# Patient Record
Sex: Male | Born: 1970 | Race: White | Hispanic: No | Marital: Married | Smoking: Never smoker
Health system: Southern US, Community
[De-identification: ages and names within clinical notes are randomized; demographics above are authoritative.]

## PROBLEM LIST (undated history)

## (undated) DIAGNOSIS — G4730 Sleep apnea, unspecified: Secondary | ICD-10-CM

## (undated) DIAGNOSIS — I10 Essential (primary) hypertension: Secondary | ICD-10-CM

## (undated) DIAGNOSIS — F419 Anxiety disorder, unspecified: Secondary | ICD-10-CM

## (undated) HISTORY — PX: OTHER SURGICAL HISTORY: SHX169

## (undated) HISTORY — PX: HERNIA REPAIR: SHX51

---

## 2007-05-13 ENCOUNTER — Ambulatory Visit: Payer: Self-pay | Admitting: Family Medicine

## 2010-08-02 ENCOUNTER — Ambulatory Visit: Payer: Self-pay | Admitting: Internal Medicine

## 2011-06-20 DIAGNOSIS — E663 Overweight: Secondary | ICD-10-CM | POA: Insufficient documentation

## 2013-11-12 ENCOUNTER — Ambulatory Visit: Payer: Self-pay | Admitting: Family Medicine

## 2013-11-12 LAB — RAPID INFLUENZA A&B ANTIGENS

## 2013-12-15 ENCOUNTER — Ambulatory Visit: Payer: Self-pay | Admitting: Emergency Medicine

## 2014-03-31 ENCOUNTER — Ambulatory Visit: Payer: Self-pay | Admitting: Registered Nurse

## 2016-05-17 DIAGNOSIS — G4733 Obstructive sleep apnea (adult) (pediatric): Secondary | ICD-10-CM | POA: Insufficient documentation

## 2016-12-25 ENCOUNTER — Encounter: Payer: Self-pay | Admitting: Emergency Medicine

## 2016-12-25 ENCOUNTER — Ambulatory Visit
Admission: EM | Admit: 2016-12-25 | Discharge: 2016-12-25 | Disposition: A | Discharge status: Home / Self Care | Payer: Managed Care, Other (non HMO) | Attending: Emergency Medicine | Admitting: Emergency Medicine

## 2016-12-25 DIAGNOSIS — M5416 Radiculopathy, lumbar region: Secondary | ICD-10-CM | POA: Diagnosis not present

## 2016-12-25 MED ORDER — PREDNISONE 10 MG PO TABS: ORAL_TABLET | ORAL | 0 refills | Status: DC

## 2016-12-25 MED ORDER — HYDROCODONE-ACETAMINOPHEN 5-325 MG PO TABS
1.0000 | ORAL_TABLET | ORAL | 0 refills | Status: DC | PRN
Start: 2016-12-25 — End: 2018-04-23

## 2016-12-25 NOTE — ED Triage Notes (Signed)
Right side sciatica pain that radiates down leg to knee

## 2016-12-25 NOTE — Discharge Instructions (Signed)
Please take medications as prescribed.  Continue with Tylenol as needed.  Use Norco as needed for severe pain.  Follow-up with primary care provider or orthopedist in 1 week if no improvement.  Return to the clinic immediately for any severe pain, weakness, loss of bowel or bladder symptoms.

## 2016-12-25 NOTE — ED Provider Notes (Signed)
[facility]    CSN: [id number] Arrival date & time: 12/25/16  1028     History   Chief Complaint Chief Complaint  Patient presents with  . Sciatica    HPI Charles Webb is a 46 y.o. male clinic for evaluation of severe right leg pain.  Patient states he is having burning sensation rating down his right posterior thigh, posterior calf and into the bottom of his right foot.  Symptoms have been constant for the last 5 days.  He denies any trauma or injury.  No fevers, abdominal pain, chest pain or shortness of breath.  He denies any weakness or loss of bowel or bladder symptoms.  He has been taken Tylenol and ibuprofen, alternating 2 medications with minimal improvement.  Pain is currently 7 out of 10.  His pain is increased with sitting, he gets some relief with standing and walking.  HPI  History reviewed. No pertinent past medical history.  There are no active problems to display for this patient.   Past Surgical History:  Procedure Laterality Date  . HERNIA REPAIR    . vascetomy         Home Medications    Prior to Admission medications   Medication Sig Start Date End Date Taking? Authorizing Provider  venlafaxine (EFFEXOR) 75 MG tablet Take 75 mg by mouth 2 (two) times daily.   Yes [provider]  HYDROcodone-acetaminophen (NORCO) 5-325 MG tablet Take 1 tablet by mouth every 4 (four) hours as needed for moderate pain. 12/25/16   Evon SlackGaines, Jaiel Saraceno C, PA-C  predniSONE (DELTASONE) 10 MG tablet 10 day taper. 5,5,4,4,3,3,2,2,1,1 12/25/16   Evon SlackGaines, Astin Rape C, PA-C    Family History Family History  Problem Relation Age of Onset  . Heart failure Father     Social History Social History   Tobacco Use  . Smoking status: Never Smoker  . Smokeless tobacco: Never Used  Substance Use Topics  . Alcohol use: No    Frequency: Never  . Drug use: No     Allergies   Patient has no known allergies.   Review of Systems Review of Systems    Constitutional: Negative for fever.  Respiratory: Negative for shortness of breath.   Cardiovascular: Negative for chest pain.  Gastrointestinal: Negative for abdominal pain.  Genitourinary: Negative for difficulty urinating, dysuria and urgency.  Musculoskeletal: Positive for back pain. Negative for myalgias.  Skin: Negative for rash.  Neurological: Positive for numbness. Negative for dizziness and headaches.     Physical Exam Triage Vital Signs ED Triage Vitals  Enc Vitals Group     BP 12/25/16 1135 131/72     Pulse Rate 12/25/16 1135 66     Resp 12/25/16 1135 18     Temp 12/25/16 1135 98.9 F (37.2 C)     Temp Source 12/25/16 1135 Oral     SpO2 12/25/16 1135 98 %     Weight 12/25/16 1138 270 lb (122.5 kg)     Height 12/25/16 1138 5\' 10"  (1.778 m)     Head Circumference --      Peak Flow --      Pain Score 12/25/16 1138 7     Pain Loc --      Pain Edu? --      Excl. in GC? --    No data found.  Updated Vital Signs BP 131/72 (BP Location: Left Arm)   Pulse 66   Temp 98.9 F (37.2 C) (Oral)   Resp 18  Ht 5\' 10"  (1.778 m)   Wt 270 lb (122.5 kg)   SpO2 98%   BMI 38.74 kg/m   Visual Acuity Right Eye Distance:   Left Eye Distance:   Bilateral Distance:    Right Eye Near:   Left Eye Near:    Bilateral Near:     Physical Exam  Constitutional: He is oriented to person, place, and time. He appears well-developed and well-nourished.  HENT:  Head: Normocephalic and atraumatic.  Eyes: Conjunctivae are normal.  Neck: Normal range of motion.  Cardiovascular: Normal rate.  Pulmonary/Chest: Effort normal. No respiratory distress.  Musculoskeletal:  Lumbar Spine: Examination of the lumbar spine reveals no bony abnormality, no edema, and no ecchymosis.  There is no step off.  The patient has full range of motion of the lumbar spine with flexion and extension.  The patient has normal lateral bend and rotation.  The patient has no pain with range of motion activities.   The patient has a negative axial load test, and a negative rotational Waddell test.  The patient is non tender along the spinous process.  The patient is non tender along the paravertebral muscles, with no muscle spasms.  The patient is non tender along the iliac crest.  The patient is non tender in the sciatic notch.  The patient is non tender along the Sacroiliac joint.  There is no Coccyx joint tenderness.    Bilateral Lower Extremities: Examination of the lower extremities reveals no bony abnormality, no edema, and no ecchymosis.  The patient has full active and passive range of motion of the hips, knees, and ankles.  There is no discomfort with range of motion exercises.  The patient is non tender along the greater trochanter region.  The patient has a negative Denna HaggardHomans' test bilaterally.  There is normal skin warmth.  There is normal capillary refill bilaterally.    Neurologic: The patient has a negative straight leg raise.  The patient has normal muscle strength testing for the quadriceps, calves, ankle dorsiflexion, ankle plantarflexion, and extensor hallicus longus.  The patient has sensation that is intact to light touch.  Patellar reflexes are normal and symmetric.   Neurological: He is alert and oriented to person, place, and time.  Skin: Skin is warm. No rash noted.  Psychiatric: He has a normal mood and affect. His behavior is normal. Thought content normal.     UC Treatments / Results  Labs (all labs ordered are listed, but only abnormal results are displayed) Labs Reviewed - No data to display  EKG  EKG Interpretation None       Radiology No results found.  Procedures Procedures (including critical care time)  Medications Ordered in UC Medications - No data to display   Initial Impression / Assessment and Plan / UC Course  I have reviewed the triage vital signs and the nursing notes.  Pertinent labs & imaging results that were available during my care of the  patient were reviewed by me and considered in my medical decision making (see chart for details).     46 year old male with right lumbar radiculopathy, S1 distribution.  Symptoms been present for 5 days.  No weakness or neurological deficits.  He is started on a 10-day steroid taper and given a prescription for Norco as needed for severe pain.  Patient will follow up with primary care provider and symptoms 10 days if no improvement.  He is educated on signs and symptoms return to clinic for.  Final Clinical  Impressions(s) / UC Diagnoses   Final diagnoses:  Right lumbar radiculopathy    ED Discharge Orders        Ordered    predniSONE (DELTASONE) 10 MG tablet     12/25/16 1157    HYDROcodone-acetaminophen (NORCO) 5-325 MG tablet  Every 4 hours PRN     12/25/16 1157          Evon Slack, [place] 12/25/16 1202

## 2017-03-10 ENCOUNTER — Ambulatory Visit
Admission: EM | Admit: 2017-03-10 | Discharge: 2017-03-10 | Disposition: A | Discharge status: Home / Self Care | Payer: Managed Care, Other (non HMO) | Attending: Family Medicine | Admitting: Family Medicine

## 2017-03-10 ENCOUNTER — Other Ambulatory Visit: Payer: Self-pay

## 2017-03-10 ENCOUNTER — Encounter: Payer: Self-pay | Admitting: Emergency Medicine

## 2017-03-10 DIAGNOSIS — J029 Acute pharyngitis, unspecified: Secondary | ICD-10-CM | POA: Diagnosis not present

## 2017-03-10 DIAGNOSIS — J069 Acute upper respiratory infection, unspecified: Secondary | ICD-10-CM

## 2017-03-10 DIAGNOSIS — B9789 Other viral agents as the cause of diseases classified elsewhere: Secondary | ICD-10-CM | POA: Diagnosis not present

## 2017-03-10 LAB — RAPID STREP SCREEN (MED CTR MEBANE ONLY): Streptococcus, Group A Screen (Direct): NEGATIVE

## 2017-03-10 NOTE — ED Provider Notes (Signed)
[facility]    CSN: [id number] Arrival date & time: 03/10/17  0802     History   Chief Complaint Chief Complaint  Patient presents with  . Sore Throat  . Generalized Body Aches    HPI Charles Webb is a 47 y.o. male.   The history is provided by the patient.  Sore Throat  Pertinent negatives include no headaches.  URI  Presenting symptoms: congestion, cough, rhinorrhea and sore throat   Severity:  Moderate Onset quality:  Sudden Duration:  5 days Timing:  Constant Progression:  Unchanged Chronicity:  New Relieved by:  OTC medications Associated symptoms: myalgias   Associated symptoms: no arthralgias, no headaches, no sinus pain and no wheezing   Risk factors: sick contacts   Risk factors: not elderly, no chronic cardiac disease, no chronic kidney disease, no chronic respiratory disease, no diabetes mellitus, no immunosuppression, no recent illness and no recent travel     History reviewed. No pertinent past medical history.  There are no active problems to display for this patient.   Past Surgical History:  Procedure Laterality Date  . HERNIA REPAIR    . vascetomy         Home Medications    Prior to Admission medications   Medication Sig Start Date End Date Taking? Authorizing Provider  venlafaxine (EFFEXOR) 75 MG tablet Take 75 mg by mouth 2 (two) times daily.   Yes [provider]  HYDROcodone-acetaminophen (NORCO) 5-325 MG tablet Take 1 tablet by mouth every 4 (four) hours as needed for moderate pain. 12/25/16   Evon Slack, PA-C  predniSONE (DELTASONE) 10 MG tablet 10 day taper. 5,5,4,4,3,3,2,2,1,1 12/25/16   Evon Slack, PA-C    Family History Family History  Problem Relation Age of Onset  . Heart failure Father     Social History Social History   Tobacco Use  . Smoking status: Never Smoker  . Smokeless tobacco: Never Used  Substance Use Topics  . Alcohol use: No    Frequency: Never  . Drug use: No       Allergies   Patient has no known allergies.   Review of Systems Review of Systems  HENT: Positive for congestion, rhinorrhea and sore throat. Negative for sinus pain.   Respiratory: Positive for cough. Negative for wheezing.   Musculoskeletal: Positive for myalgias. Negative for arthralgias.  Neurological: Negative for headaches.     Physical Exam Triage Vital Signs ED Triage Vitals  Enc Vitals Group     BP 03/10/17 0812 136/84     Pulse Rate 03/10/17 0812 74     Resp 03/10/17 0812 16     Temp 03/10/17 0812 98.5 F (36.9 C)     Temp Source 03/10/17 0812 Oral     SpO2 03/10/17 0812 97 %     Weight 03/10/17 0812 275 lb (124.7 kg)     Height 03/10/17 0812 5\' 10"  (1.778 m)     Head Circumference --      Peak Flow --      Pain Score 03/10/17 0811 4     Pain Loc --      Pain Edu? --      Excl. in GC? --    No data found.  Updated Vital Signs BP 136/84 (BP Location: Left Arm)   Pulse 74   Temp 98.5 F (36.9 C) (Oral)   Resp 16   Ht 5\' 10"  (1.778 m)   Wt 275 lb (124.7 kg)  SpO2 97%   BMI 39.46 kg/m   Visual Acuity Right Eye Distance:   Left Eye Distance:   Bilateral Distance:    Right Eye Near:   Left Eye Near:    Bilateral Near:     Physical Exam  Constitutional: He appears well-developed and well-nourished. No distress.  HENT:  Head: Normocephalic and atraumatic.  Right Ear: Tympanic membrane, external ear and ear canal normal.  Left Ear: Tympanic membrane, external ear and ear canal normal.  Nose: Nose normal.  Mouth/Throat: Uvula is midline, oropharynx is clear and moist and mucous membranes are normal. No oropharyngeal exudate or tonsillar abscesses.  Eyes: Conjunctivae and EOM are normal. Pupils are equal, round, and reactive to light. Right eye exhibits no discharge. Left eye exhibits no discharge. No scleral icterus.  Neck: Normal range of motion. Neck supple. No tracheal deviation present. No thyromegaly present.  Cardiovascular: Normal  rate, regular rhythm and normal heart sounds.  Pulmonary/Chest: Effort normal and breath sounds normal. No stridor. No respiratory distress. He has no wheezes. He has no rales. He exhibits no tenderness.  Lymphadenopathy:    He has no cervical adenopathy.  Neurological: He is alert.  Skin: Skin is warm and dry. No rash noted. He is not diaphoretic.  Nursing note and vitals reviewed.    UC Treatments / Results  Labs (all labs ordered are listed, but only abnormal results are displayed) Labs Reviewed  RAPID STREP SCREEN (NOT AT [facility])  CULTURE, GROUP A STREP [facility])    EKG  EKG Interpretation None       Radiology No results found.  Procedures Procedures (including critical care time)  Medications Ordered in UC Medications - No data to display   Initial Impression / Assessment and Plan / UC Course  I have reviewed the triage vital signs and the nursing notes.  Pertinent labs & imaging results that were available during my care of the patient were reviewed by me and considered in my medical decision making (see chart for details).      Final Clinical Impressions(s) / UC Diagnoses   Final diagnoses:  Viral URI    ED Discharge Orders    None     1. Lab results and diagnosis reviewed with patient 2. Recommend supportive treatment with rest, fluids, otc analgesics 3. Follow-up prn if symptoms worsen or don't improve  Controlled Substance Prescriptions [place] Controlled Substance Registry consulted? Not Applicable   Payton Mccallumonty, Edana Aguado, MD 03/10/17 1019

## 2017-03-10 NOTE — ED Triage Notes (Signed)
Patient c/o sore throat, bodyaches, nasal congestion that started on Monday.  Patient denies fevers.

## 2017-03-12 LAB — CULTURE, GROUP A STREP (THRC)

## 2017-03-13 ENCOUNTER — Telehealth: Payer: Self-pay

## 2017-03-13 NOTE — Telephone Encounter (Signed)
Called to follow up with patient since visit here at [facility]. Patient instructed to call back with any questions or concerns. MAH  

## 2018-04-23 ENCOUNTER — Other Ambulatory Visit: Payer: Self-pay

## 2018-04-23 ENCOUNTER — Ambulatory Visit (INDEPENDENT_AMBULATORY_CARE_PROVIDER_SITE_OTHER): Payer: 59

## 2018-04-23 ENCOUNTER — Ambulatory Visit
Admission: EM | Admit: 2018-04-23 | Discharge: 2018-04-23 | Disposition: A | Discharge status: Home / Self Care | Payer: 59 | Attending: Family Medicine | Admitting: Family Medicine

## 2018-04-23 ENCOUNTER — Encounter: Payer: Self-pay | Admitting: Emergency Medicine

## 2018-04-23 DIAGNOSIS — W1789XA Other fall from one level to another, initial encounter: Secondary | ICD-10-CM

## 2018-04-23 DIAGNOSIS — S2231XA Fracture of one rib, right side, initial encounter for closed fracture: Secondary | ICD-10-CM

## 2018-04-23 DIAGNOSIS — M25511 Pain in right shoulder: Secondary | ICD-10-CM | POA: Diagnosis not present

## 2018-04-23 HISTORY — DX: Anxiety disorder, unspecified: F41.9

## 2018-04-23 MED ORDER — HYDROCODONE-ACETAMINOPHEN 5-325 MG PO TABS: 1.0000 | ORAL_TABLET | Freq: Three times a day (TID) | ORAL | 0 refills | Status: DC | PRN

## 2018-04-23 MED ORDER — MELOXICAM 15 MG PO TABS: 15.0000 mg | ORAL_TABLET | Freq: Every day | ORAL | 0 refills | Status: DC | PRN

## 2018-04-23 NOTE — ED Triage Notes (Signed)
Patient in today c/o right sided rib/chest pain and right shoulder/arm pain after falling ~6 feet and landing on his right side on Saturday (04/21/18).

## 2018-04-23 NOTE — ED Provider Notes (Signed)
[facility]    CSN: [id number] Arrival date & time: 04/23/18  1142   History   Chief Complaint Chief Complaint  Patient presents with  . Rib Injury    DOI 04/21/18 APPT  . Shoulder Injury   HPI  48 year old male presents with the above complaints.  Patient states that he fell out of a boat in the driveway Saturday.  He landed on the ground on his right side.  Since that time he has had right-sided rib pain and shoulder pain.  Worse with deep inspiration.  Worse with cough.  Pain is 7/10 in severity.  No relieving factors.  Denies bruising.  Denies shortness of breath.  No reports of head injury.  No other associated symptoms.  No other complaints  PMH, Surgical Hx, Family Hx, Social History reviewed and updated as below.  Past Medical History:  Diagnosis Date  . Anxiety    Past Surgical History:  Procedure Laterality Date  . HERNIA REPAIR    . vascetomy     Home Medications    Prior to Admission medications   Medication Sig Start Date End Date Taking? Authorizing Provider  venlafaxine (EFFEXOR) 75 MG tablet Take 75 mg by mouth 2 (two) times daily.   Yes [provider]  HYDROcodone-acetaminophen (NORCO/VICODIN) 5-325 MG tablet Take 1 tablet by mouth every 8 (eight) hours as needed for moderate pain or severe pain. 04/23/18   Tommie Sams, DO  meloxicam (MOBIC) 15 MG tablet Take 1 tablet (15 mg total) by mouth daily as needed for pain. 04/23/18   Tommie Sams, DO    Family History Family History  Problem Relation Age of Onset  . Heart failure Father   . Healthy Mother     Social History Social History   Tobacco Use  . Smoking status: Never Smoker  . Smokeless tobacco: Never Used  Substance Use Topics  . Alcohol use: Yes    Frequency: Never    Comment: rarely  . Drug use: No     Allergies   Patient has no known allergies.   Review of Systems Review of Systems  Musculoskeletal:       Rib pain/chest pain (R) R shoulder pain.    Physical Exam Triage Vital Signs ED Triage Vitals  Enc Vitals Group     BP 04/23/18 1203 (!) 159/90     Pulse Rate 04/23/18 1203 68     Resp 04/23/18 1203 16     Temp 04/23/18 1203 98.3 F (36.8 C)     Temp Source 04/23/18 1203 Oral     SpO2 04/23/18 1203 99 %     Weight 04/23/18 1201 280 lb (127 kg)     Height 04/23/18 1201  (1.778 m)     Head Circumference --      Peak Flow --      Pain Score 04/23/18 1201 7     Pain Loc --      Pain Edu? --      Excl. in GC? --    Updated Vital Signs BP (!) 159/90 (BP Location: Left Arm)   Pulse 68   Temp 98.3 F (36.8 C) (Oral)   Resp 16   Ht  (1.778 m)   Wt 127 kg   SpO2 99%   BMI 40.18 kg/m   Visual Acuity Right Eye Distance:   Left Eye Distance:   Bilateral Distance:    Right Eye Near:   Left Eye Near:  Bilateral Near:     Physical Exam Vitals signs and nursing note reviewed.  Constitutional:      Appearance: Normal appearance. He is obese.  HENT:     Head: Normocephalic and atraumatic.  Eyes:     General:        Right eye: No discharge.        Left eye: No discharge.     Conjunctiva/sclera: Conjunctivae normal.  Cardiovascular:     Rate and Rhythm: Normal rate and regular rhythm.  Pulmonary:     Effort: Pulmonary effort is normal.     Breath sounds: Normal breath sounds.  Chest:     Chest wall: No tenderness.  Musculoskeletal:     Comments: Right shoulder -rotator cuff strength intact.  Decreased range of motion secondary to pain.  Skin:    General: Skin is warm.     Findings: No bruising.  Neurological:     Mental Status: He is alert.  Psychiatric:        Mood and Affect: Mood normal.        Behavior: Behavior normal.    UC Treatments / Results  Labs (all labs ordered are listed, but only abnormal results are displayed) Labs Reviewed - No data to display  EKG None  Radiology Dg Ribs Unilateral W/chest Right  Result Date: 04/23/2018 CLINICAL DATA:  Cough, chest pain after fall.  EXAM: RIGHT RIBS AND CHEST - 3+ VIEW COMPARISON:  None. FINDINGS: Minimally displaced fracture is seen involving lateral portion of right sixth rib. There is no evidence of pneumothorax or pleural effusion. Both lungs are clear. Heart size and mediastinal contours are within normal limits. IMPRESSION: Minimally displaced right sixth rib fracture. Electronically Signed   By: Lupita Raider, M.D.   On: 04/23/2018 12:59    Procedures Procedures (including critical care time)  Medications Ordered in UC Medications - No data to display  Initial Impression / Assessment and Plan / UC Course  I have reviewed the triage vital signs and the nursing notes.  Pertinent labs & imaging results that were available during my care of the patient were reviewed by me and considered in my medical decision making (see chart for details).    48 year old male presents for evaluation after suffering a fall.  X-ray revealed a right sixth rib fracture.  Patient also has pain in the right shoulder.  Rest, ice.  Meloxicam as directed.  Vicodin as needed for severe pain associated with his right rib fracture.  [place] controlled substance database reviewed.  No concerns for abuse.  Final Clinical Impressions(s) / UC Diagnoses   Final diagnoses:  Closed fracture of one rib of right side, initial encounter  Acute pain of right shoulder     Discharge Instructions     Rest.  Pain medication as needed.  Be sure to take deep breaths.  Take care  Dr. Adriana Simas     ED Prescriptions    Medication Sig Dispense Auth. Provider   HYDROcodone-acetaminophen (NORCO/VICODIN) 5-325 MG tablet Take 1 tablet by mouth every 8 (eight) hours as needed for moderate pain or severe pain. 10 tablet Louie Meaders G, DO   meloxicam (MOBIC) 15 MG tablet Take 1 tablet (15 mg total) by mouth daily as needed for pain. 30 tablet Tommie Sams, DO     Controlled Substance Prescriptions [place] Controlled Substance Registry consulted? Yes,  I have consulted the [place] Controlled Substances Registry for this patient, and feel the risk/benefit ratio today is favorable  for proceeding with this prescription for a controlled substance.   Tommie Sams, [place] 04/23/18 1315

## 2018-04-23 NOTE — Discharge Instructions (Addendum)
Rest.  Pain medication as needed.  Be sure to take deep breaths.  Take care  Dr. Adriana Simas

## 2018-04-24 ENCOUNTER — Telehealth: Payer: Self-pay | Admitting: Family Medicine

## 2018-04-24 MED ORDER — OXYCODONE-ACETAMINOPHEN 5-325 MG PO TABS: ORAL_TABLET | ORAL | 0 refills | Status: DC

## 2018-04-24 NOTE — Telephone Encounter (Signed)
rx printed

## 2018-05-14 ENCOUNTER — Other Ambulatory Visit: Payer: Self-pay | Admitting: Family Medicine

## 2018-05-18 ENCOUNTER — Other Ambulatory Visit: Payer: Self-pay

## 2018-05-18 ENCOUNTER — Encounter: Payer: Self-pay | Admitting: Emergency Medicine

## 2018-05-18 ENCOUNTER — Ambulatory Visit (INDEPENDENT_AMBULATORY_CARE_PROVIDER_SITE_OTHER): Payer: 59

## 2018-05-18 ENCOUNTER — Ambulatory Visit
Admission: EM | Admit: 2018-05-18 | Discharge: 2018-05-18 | Disposition: A | Discharge status: Home / Self Care | Payer: 59 | Attending: Urgent Care | Admitting: Urgent Care

## 2018-05-18 DIAGNOSIS — M25511 Pain in right shoulder: Secondary | ICD-10-CM

## 2018-05-18 DIAGNOSIS — W1789XD Other fall from one level to another, subsequent encounter: Secondary | ICD-10-CM

## 2018-05-18 MED ORDER — TRAMADOL HCL 50 MG PO TABS: 50.0000 mg | ORAL_TABLET | Freq: Two times a day (BID) | ORAL | 0 refills | Status: DC | PRN

## 2018-05-18 NOTE — ED Provider Notes (Signed)
[address], [address] [place], [place] [address] [phone]   Name: Charles Webb DOB: [birth year] MRN: [id number] CSN: [id number]  Arrival date and time:  05/18/18 0831  Chief Complaint:  Shoulder Pain (right)  NOTE: Prior to seeing the patient today, I have reviewed the triage nursing documentation and vital signs. Clinical staff has updated patient's PMH/PSHx, current medication list, and drug allergies/intolerances to ensure comprehensive history available to assist in medical decision making.   History:   HPI: Charles Webb is a 48 y.o. male who presents today with complaints of RIGHT shoulder pain x 1 month. Patient suffered a 6 foot fall that resulted in a rib fracture and post-traumatic pain in the shoulder. Patient was seen her at [facility] by Dr. Adriana Simas; notes reviewed. Rotator cuff felt to be stable; no clinical concerns for underlying cuff arthropathy at that time. Patient was prescribed a course of meloxicam and advised to rest and ice his shoulder. No imaging was obtained of the affected shoulder following the initial incident.   He presents today with continued pain in the shoulder. Patient has full AROM, however he notes constant "aching" pain ranging 4-5/10. He notes that his pain is worse at night and interferes with his sleep. Patient denies distal paraesthesias, however he does note that his arm is "weaker than it normally is".   Past Medical History:  Diagnosis Date  . Anxiety     Past Surgical History:  Procedure Laterality Date  . HERNIA REPAIR    . vascetomy      Family History  Problem Relation Age of Onset  . Heart failure Father   . Healthy Mother     Social History   Socioeconomic History  . Marital status: Married    Spouse name: Not on file  . Number of children: Not on file  . Years of education: Not on file  . Highest education level: Not on file  Occupational History  . Not on file  Social Needs  . Financial resource strain: Not  on file  . Food insecurity:    Worry: Not on file    Inability: Not on file  . Transportation needs:    Medical: Not on file    Non-medical: Not on file  Tobacco Use  . Smoking status: Never Smoker  . Smokeless tobacco: Never Used  Substance and Sexual Activity  . Alcohol use: Yes    Frequency: Never    Comment: rarely  . Drug use: No  . Sexual activity: Yes  Lifestyle  . Physical activity:    Days per week: Not on file    Minutes per session: Not on file  . Stress: Not on file  Relationships  . Social connections:    Talks on phone: Not on file    Gets together: Not on file    Attends religious service: Not on file    Active member of club or organization: Not on file    Attends meetings of clubs or organizations: Not on file    Relationship status: Not on file  . Intimate partner violence:    Fear of current or ex partner: Not on file    Emotionally abused: Not on file    Physically abused: Not on file    Forced sexual activity: Not on file  Other Topics Concern  . Not on file  Social History Narrative  . Not on file    There are no active problems to display for this patient.  Home Medications:    Current Meds  Medication Sig  . meloxicam (MOBIC) 15 MG tablet Take 1 tablet (15 mg total) by mouth daily as needed for pain.  Marland Kitchen venlafaxine (EFFEXOR) 75 MG tablet Take 75 mg by mouth 2 (two) times daily.    Allergies:   Patient has no known allergies.  Review of Systems (ROS): Review of Systems  Constitutional: Negative for activity change and fatigue.  Respiratory: Negative for cough and shortness of breath.   Cardiovascular: Negative for chest pain and palpitations.  Musculoskeletal:       RIGHT shoulder pain  Skin: Negative for color change and rash.  Neurological: Positive for weakness (slight RUE). Negative for numbness.  All other systems reviewed and are negative.    Physical Exam:  Triage Vital Signs ED Triage Vitals  Enc Vitals Group      BP 05/18/18 0903 (!) 158/94     Pulse Rate 05/18/18 0903 77     Resp 05/18/18 0903 18     Temp 05/18/18 0903 98.5 F (36.9 C)     Temp Source 05/18/18 0903 Oral     SpO2 05/18/18 0903 98 %     Weight 05/18/18 0900 285 lb (129.3 kg)     Height 05/18/18 0900  (1.778 m)     Head Circumference --      Peak Flow --      Pain Score 05/18/18 0900 4     Pain Loc --      Pain Edu? --      Excl. in GC? --     Physical Exam  Constitutional: He is oriented to person, place, and time and well-developed, well-nourished, and in no distress.  HENT:  Mouth/Throat: Mucous membranes are normal.  Eyes: Pupils are equal, round, and reactive to light. EOM are normal.  Cardiovascular: Normal rate, regular rhythm, normal heart sounds and intact distal pulses. Exam reveals no gallop and no friction rub.  No murmur heard. Pulmonary/Chest: Effort normal and breath sounds normal. No respiratory distress. He has no wheezes. He has no rales.  Musculoskeletal:     Right shoulder: He exhibits pain. He exhibits no swelling, no spasm, normal pulse and normal strength.  Neurological: He is alert and oriented to person, place, and time.  Skin: Skin is warm and dry. No rash noted. No erythema.  Psychiatric: Mood, affect and judgment normal.  Nursing note and vitals reviewed.    Urgent Care Treatments / Results:   LABS: PLEASE NOTE: all labs that were ordered this encounter are listed, however only abnormal results are displayed. Labs Reviewed - No data to display  RADIOLOGY: Dg Shoulder Right  Result Date: 05/18/2018 CLINICAL DATA:  Pain and decreased range of motion EXAM: RIGHT SHOULDER - 2+ VIEW COMPARISON:  None. FINDINGS: Oblique, Y scapular, and axillary images were obtained. There is no evident acute fracture or dislocation. There is moderate narrowing in the acromioclavicular joint region with mild narrowing of the glenohumeral joint. A small calcification is noted immediately lateral to the  acromion. No other intra-articular calcification evident. No erosive change. Visualized right lung is clear. 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 Probable calcific tendinosis immediately lateral to the right acromion. No fracture or dislocation. Electronically Signed   By: Bretta Bang III M.D.   On: 05/18/2018 09:26    PRODEDURES: Procedures  MEDICATIONS RECEIVED THIS VISIT: Medications - No data to display     Initial Impression /  Assessment and Plan / Urgent Care Course:   Pertinent labs & imaging results that were available during my care of the patient were personally reviewed by me and considered in my medical decision making (see chart for details).   Charles Webb is a 48 y.o. male who [facility] with complaints of continued pain in his RIGHT shoulder s/p a fall approximately 1 month ago. Exam reveals full AROM, however he notes consistent "aching" pain that is worse at night. Patient previously seen here in the past and prescribed interventions, however he advises that pain persists despite interventions.   Diagnostic plain films reviewed as negative for acute osseous concern. There are some degenerative changes noted. Patient encouraged to continue heat/ice as needed for comfort. He will continue the meloxicam as previous prescribed; still has supply. Will treat continued pain with tramadol and refer to orthopedics for further evaluation of possible cuff arthropathy.   I have reviewed the follow up and strict return precautions for any new or worsening symptoms. Patient is aware of symptoms that would be deemed urgent/emergent, and would thus require further evaluation either here or in the emergency department. At the time of discharge, he verbalized understanding and consent with the discharge plan as it was reviewed with him. All questions were fielded by provider and/or  clinic staff prior to patient discharge.    Final Clinical Impressions(s) / Urgent Care Diagnoses:   Final diagnoses:  Right shoulder pain, unspecified chronicity    New Prescriptions:   Meds ordered this encounter  Medications  . traMADol (ULTRAM) 50 MG tablet    Sig: Take 1 tablet (50 mg total) by mouth every 12 (twelve) hours as needed.    Dispense:  15 tablet    Refill:  0    Controlled Substance Prescriptions:  [place] Controlled Substance Registry consulted? Yes - I have consulted the  Controlled Substances Registry for this patient, and feel the risk/benefit ratio today is favorable for proceeding with this prescription for a controlled substance.  NOTE: This note was prepared using Scientist, clinical (histocompatibility and immunogenetics)Dragon dictation software along with smaller Lobbyistphrase technology. Despite my best ability to proofread, there is the potential that transcriptional errors may still occur from this process, and are completely unintentional.     Verlee MonteGray, Beulah Capobianco E, NP 05/18/18 1002
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
## 2018-05-18 NOTE — Discharge Instructions (Signed)
It was very nice meeting you today in clinic. Thank you for entrusting me with your care.   As discussed, your plain films are negative for anything acute. They showed some arthritic changes. You may have an injury to your rotator cuff that will require further imaging.  Schedule appointment with orthopedics for further evaluation. I have given your the name of an excellent provider.  Please utilize the medications that we discussed. Your prescription has been called in to your pharmacy.  Moist heat, alternating with ice, application several times a day may help improve your pain.   Make arrangements to follow up with your regular doctor and/or the specialist for re-evaluation and further treatment as dicussed. If your symptoms/condition worsens, please seek follow up care either here or in the ER. Please remember, our [facility] providers are "right here with you" when you need [place].   Again, it was my pleasure to take care of you today. Thank you for choosing our clinic. I hope that you start to feel better quickly.   Quentin Mulling, MSN, APRN, FNP-C, CEN Advanced Practice Provider [facility]

## 2018-06-19 ENCOUNTER — Other Ambulatory Visit: Payer: Self-pay | Admitting: Orthopedic Surgery

## 2018-06-19 DIAGNOSIS — M25311 Other instability, right shoulder: Secondary | ICD-10-CM

## 2018-06-19 DIAGNOSIS — S46001D Unspecified injury of muscle(s) and tendon(s) of the rotator cuff of right shoulder, subsequent encounter: Secondary | ICD-10-CM

## 2018-07-02 ENCOUNTER — Ambulatory Visit
Admission: RE | Admit: 2018-07-02 | Discharge: 2018-07-02 | Disposition: A | Discharge status: Home / Self Care | Payer: 59 | Source: Ambulatory Visit | Attending: Orthopedic Surgery | Admitting: Orthopedic Surgery

## 2018-07-02 ENCOUNTER — Other Ambulatory Visit: Payer: Self-pay

## 2018-07-02 DIAGNOSIS — M25311 Other instability, right shoulder: Secondary | ICD-10-CM

## 2018-07-02 DIAGNOSIS — S46001D Unspecified injury of muscle(s) and tendon(s) of the rotator cuff of right shoulder, subsequent encounter: Secondary | ICD-10-CM

## 2018-07-24 ENCOUNTER — Encounter
Admission: RE | Admit: 2018-07-24 | Discharge: 2018-07-24 | Disposition: A | Discharge status: Home / Self Care | Payer: 59 | Source: Ambulatory Visit | Attending: Orthopedic Surgery | Admitting: Orthopedic Surgery

## 2018-07-24 ENCOUNTER — Other Ambulatory Visit: Payer: Self-pay

## 2018-07-24 HISTORY — DX: Sleep apnea, unspecified: G47.30

## 2018-07-24 NOTE — Patient Instructions (Signed)
Your procedure is scheduled on: July 30, 2018 [facility] Report to Day Surgery on the 2nd floor of the [facility]. To find out your arrival time, please call [phone] between 1PM - 3PM on: July 27, 2018 Friday   REMEMBER: Instructions that are not followed completely may result in serious medical risk, up to and including death; or upon the discretion of your surgeon and anesthesiologist your surgery may need to be rescheduled.  Do not eat food after midnight the night before surgery.  No gum chewing, lozengers or hard candies.  You may however, drink CLEAR liquids up to 2 hours before you are scheduled to arrive for your surgery. Do not drink anything within 2 hours of the start of your surgery.  Clear liquids include: - water  - apple juice without pulp -CLEAR gatorade - black coffee or tea (Do NOT add milk or creamers to the coffee or tea) Do NOT drink anything that is not on this list.  Type 1 and Type 2 diabetics should only drink water.  No Alcohol for 24 hours before or after surgery.  No Smoking including e-cigarettes for 24 hours prior to surgery.  No chewable tobacco products for at least 6 hours prior to surgery.  No nicotine patches on the day of surgery.  On the morning of surgery brush your teeth with toothpaste and water, you may rinse your mouth with mouthwash if you wish. Do not swallow any toothpaste or mouthwash.  Notify your doctor if there is any change in your medical condition (cold, fever, infection).  Do not wear jewelry, make-up, hairpins, clips or nail polish.  Do not wear lotions, powders, or perfumes.   Do not shave 48 hours prior to surgery.   Contacts and dentures may not be worn into surgery.  Do not bring valuables to the hospital, including drivers license, insurance or credit cards.  [facility] is not responsible for any belongings or valuables.   TAKE THESE MEDICATIONS THE MORNING OF SURGERY: VENLAFAXINE   Use CHG Soap  as  directed on instruction sheet.  Bring your C-PAP to the hospital with you in case you may have to spend the night.   Stop Anti-inflammatories (NSAIDS) such as MELOXICAM  Advil, Aleve, Ibuprofen, Motrin, Naproxen, Naprosyn and Aspirin based products such as Excedrin, Goodys Powder, BC Powder. (May take Tylenol or Acetaminophen if needed.)  Stop ANY OVER THE COUNTER supplements until after surgery. (May continue Vitamin D, Vitamin B, and multivitamin.)  Wear comfortable clothing (specific to your surgery type) to the hospital.  Plan for stool softeners for home use.   If you are being discharged the day of surgery, you will not be allowed to drive home. You will need a responsible adult to drive you home and stay with you that night.   If you are taking public transportation, you will need to have a responsible adult with you. Please confirm with your physician that it is acceptable to use public transportation.   Please call [phone] if you have any questions about these instructions.

## 2018-07-26 ENCOUNTER — Other Ambulatory Visit: Payer: Self-pay

## 2018-07-26 ENCOUNTER — Other Ambulatory Visit
Admission: RE | Admit: 2018-07-26 | Discharge: 2018-07-26 | Disposition: A | Discharge status: Home / Self Care | Payer: 59 | Source: Ambulatory Visit | Attending: Orthopedic Surgery | Admitting: Orthopedic Surgery

## 2018-07-26 DIAGNOSIS — Z1159 Encounter for screening for other viral diseases: Secondary | ICD-10-CM | POA: Diagnosis not present

## 2018-07-27 LAB — NOVEL CORONAVIRUS, NAA (HOSP ORDER, SEND-OUT TO REF LAB; TAT 18-24 HRS): SARS-CoV-2, NAA: NOT DETECTED

## 2018-07-29 MED ORDER — DEXTROSE 5 % IV SOLN
3.0000 g | Freq: Once | INTRAVENOUS | Status: AC
  Administered 2018-07-30: 3 g via INTRAVENOUS
  Filled 2018-07-29: qty 3

## 2018-07-30 ENCOUNTER — Ambulatory Visit: Payer: 59 | Admitting: Certified Registered"

## 2018-07-30 ENCOUNTER — Other Ambulatory Visit: Payer: Self-pay

## 2018-07-30 ENCOUNTER — Encounter: Payer: Self-pay | Admitting: Emergency Medicine

## 2018-07-30 ENCOUNTER — Encounter
Admission: RE | Disposition: A | Discharge status: Home / Self Care | Payer: Self-pay | Source: Home / Self Care | Attending: Orthopedic Surgery

## 2018-07-30 ENCOUNTER — Ambulatory Visit
Admission: RE | Admit: 2018-07-30 | Discharge: 2018-07-30 | Disposition: A | Discharge status: Home / Self Care | Payer: 59 | Attending: Orthopedic Surgery | Admitting: Orthopedic Surgery

## 2018-07-30 DIAGNOSIS — Z79899 Other long term (current) drug therapy: Secondary | ICD-10-CM | POA: Diagnosis not present

## 2018-07-30 DIAGNOSIS — F419 Anxiety disorder, unspecified: Secondary | ICD-10-CM | POA: Insufficient documentation

## 2018-07-30 DIAGNOSIS — G4730 Sleep apnea, unspecified: Secondary | ICD-10-CM | POA: Insufficient documentation

## 2018-07-30 DIAGNOSIS — S46011A Strain of muscle(s) and tendon(s) of the rotator cuff of right shoulder, initial encounter: Secondary | ICD-10-CM | POA: Insufficient documentation

## 2018-07-30 DIAGNOSIS — W1789XA Other fall from one level to another, initial encounter: Secondary | ICD-10-CM | POA: Diagnosis not present

## 2018-07-30 HISTORY — PX: SHOULDER ARTHROSCOPY WITH SUBACROMIAL DECOMPRESSION, ROTATOR CUFF REPAIR AND BICEP TENDON REPAIR: SHX5687

## 2018-07-30 SURGERY — SHOULDER ARTHROSCOPY WITH SUBACROMIAL DECOMPRESSION, ROTATOR CUFF REPAIR AND BICEP TENDON REPAIR
Anesthesia: General | Laterality: Right

## 2018-07-30 MED ORDER — LACTATED RINGERS IV SOLN
INTRAVENOUS | Status: DC
  Administered 2018-07-30: 08:00:00 via INTRAVENOUS

## 2018-07-30 MED ORDER — FAMOTIDINE 20 MG PO TABS
20.0000 mg | ORAL_TABLET | Freq: Once | ORAL | Status: AC
  Administered 2018-07-30: 06:00:00 20 mg via ORAL

## 2018-07-30 MED ORDER — ACETAMINOPHEN 10 MG/ML IV SOLN
INTRAVENOUS | Status: DC | PRN
  Administered 2018-07-30: 1000 mg via INTRAVENOUS

## 2018-07-30 MED ORDER — SUGAMMADEX SODIUM 500 MG/5ML IV SOLN
INTRAVENOUS | Status: AC
  Filled 2018-07-30: qty 5

## 2018-07-30 MED ORDER — ROCURONIUM BROMIDE 50 MG/5ML IV SOLN
INTRAVENOUS | Status: AC
  Filled 2018-07-30: qty 2

## 2018-07-30 MED ORDER — SUCCINYLCHOLINE CHLORIDE 20 MG/ML IJ SOLN
INTRAMUSCULAR | Status: DC | PRN
  Administered 2018-07-30: 200 mg via INTRAVENOUS

## 2018-07-30 MED ORDER — FENTANYL CITRATE (PF) 100 MCG/2ML IJ SOLN
INTRAMUSCULAR | Status: AC
  Administered 2018-07-30: 50 ug via INTRAVENOUS
  Filled 2018-07-30: qty 2

## 2018-07-30 MED ORDER — FENTANYL CITRATE (PF) 100 MCG/2ML IJ SOLN
INTRAMUSCULAR | Status: DC | PRN
  Administered 2018-07-30 (×4): 50 ug via INTRAVENOUS

## 2018-07-30 MED ORDER — DEXMEDETOMIDINE HCL IN NACL 80 MCG/20ML IV SOLN
INTRAVENOUS | Status: AC
  Filled 2018-07-30: qty 20

## 2018-07-30 MED ORDER — BUPIVACAINE HCL (PF) 0.5 % IJ SOLN
INTRAMUSCULAR | Status: DC | PRN
  Administered 2018-07-30: 3 mL via PERINEURAL
  Administered 2018-07-30: 7 mL via PERINEURAL

## 2018-07-30 MED ORDER — MIDAZOLAM HCL 2 MG/2ML IJ SOLN
INTRAMUSCULAR | Status: AC
  Administered 2018-07-30: 1 mg via INTRAVENOUS
  Filled 2018-07-30: qty 2

## 2018-07-30 MED ORDER — EPHEDRINE SULFATE 50 MG/ML IJ SOLN
INTRAMUSCULAR | Status: AC
  Filled 2018-07-30: qty 1

## 2018-07-30 MED ORDER — ONDANSETRON HCL 4 MG/2ML IJ SOLN
INTRAMUSCULAR | Status: AC
  Filled 2018-07-30: qty 2

## 2018-07-30 MED ORDER — ACETAMINOPHEN 500 MG PO TABS: 1000.0000 mg | ORAL_TABLET | Freq: Three times a day (TID) | ORAL | 2 refills | Status: AC

## 2018-07-30 MED ORDER — FENTANYL CITRATE (PF) 100 MCG/2ML IJ SOLN
INTRAMUSCULAR | Status: AC
  Filled 2018-07-30: qty 2

## 2018-07-30 MED ORDER — FAMOTIDINE 20 MG PO TABS
ORAL_TABLET | ORAL | Status: AC
  Administered 2018-07-30: 20 mg via ORAL
  Filled 2018-07-30: qty 1

## 2018-07-30 MED ORDER — BUPIVACAINE LIPOSOME 1.3 % IJ SUSP
INTRAMUSCULAR | Status: DC | PRN
  Administered 2018-07-30: 13 mL via PERINEURAL
  Administered 2018-07-30: 7 mL via PERINEURAL

## 2018-07-30 MED ORDER — FENTANYL CITRATE (PF) 100 MCG/2ML IJ SOLN
50.0000 ug | Freq: Once | INTRAMUSCULAR | Status: AC
  Administered 2018-07-30: 50 ug via INTRAVENOUS

## 2018-07-30 MED ORDER — MIDAZOLAM HCL 2 MG/2ML IJ SOLN
INTRAMUSCULAR | Status: DC | PRN
  Administered 2018-07-30: 1 mg via INTRAVENOUS

## 2018-07-30 MED ORDER — SEVOFLURANE IN SOLN
RESPIRATORY_TRACT | Status: AC
  Filled 2018-07-30: qty 250

## 2018-07-30 MED ORDER — DEXMEDETOMIDINE HCL IN NACL 200 MCG/50ML IV SOLN
INTRAVENOUS | Status: DC | PRN
  Administered 2018-07-30: 4 ug via INTRAVENOUS
  Administered 2018-07-30: 8 ug via INTRAVENOUS
  Administered 2018-07-30: 4 ug via INTRAVENOUS

## 2018-07-30 MED ORDER — MIDAZOLAM HCL 2 MG/2ML IJ SOLN
INTRAMUSCULAR | Status: AC
  Filled 2018-07-30: qty 2

## 2018-07-30 MED ORDER — ONDANSETRON HCL 4 MG/2ML IJ SOLN
INTRAMUSCULAR | Status: DC | PRN
  Administered 2018-07-30: 4 mg via INTRAVENOUS

## 2018-07-30 MED ORDER — SUGAMMADEX SODIUM 500 MG/5ML IV SOLN
INTRAVENOUS | Status: DC | PRN
  Administered 2018-07-30: 400 mg via INTRAVENOUS

## 2018-07-30 MED ORDER — OXYCODONE HCL 5 MG/5ML PO SOLN: 5.0000 mg | Freq: Once | ORAL | Status: DC | PRN

## 2018-07-30 MED ORDER — ACETAMINOPHEN 10 MG/ML IV SOLN
INTRAVENOUS | Status: AC
  Filled 2018-07-30: qty 100

## 2018-07-30 MED ORDER — PROPOFOL 10 MG/ML IV BOLUS
INTRAVENOUS | Status: DC | PRN
  Administered 2018-07-30: 200 mg via INTRAVENOUS
  Administered 2018-07-30: 30 mg via INTRAVENOUS

## 2018-07-30 MED ORDER — ASPIRIN EC 325 MG PO TBEC: 325.0000 mg | DELAYED_RELEASE_TABLET | Freq: Every day | ORAL | 0 refills | Status: AC

## 2018-07-30 MED ORDER — PHENYLEPHRINE HCL (PRESSORS) 10 MG/ML IV SOLN
INTRAVENOUS | Status: DC | PRN
  Administered 2018-07-30 (×3): 50 ug via INTRAVENOUS

## 2018-07-30 MED ORDER — LIDOCAINE-EPINEPHRINE 1 %-1:100000 IJ SOLN
INTRAMUSCULAR | Status: AC
  Filled 2018-07-30: qty 1

## 2018-07-30 MED ORDER — DEXAMETHASONE SODIUM PHOSPHATE 10 MG/ML IJ SOLN
INTRAMUSCULAR | Status: AC
  Filled 2018-07-30: qty 1

## 2018-07-30 MED ORDER — ROCURONIUM BROMIDE 100 MG/10ML IV SOLN
INTRAVENOUS | Status: DC | PRN
  Administered 2018-07-30: 30 mg via INTRAVENOUS
  Administered 2018-07-30: 50 mg via INTRAVENOUS
  Administered 2018-07-30: 20 mg via INTRAVENOUS
  Administered 2018-07-30: 30 mg via INTRAVENOUS

## 2018-07-30 MED ORDER — MIDAZOLAM HCL 2 MG/2ML IJ SOLN
1.0000 mg | Freq: Once | INTRAMUSCULAR | Status: AC
  Administered 2018-07-30: 1 mg via INTRAVENOUS

## 2018-07-30 MED ORDER — EPHEDRINE SULFATE 50 MG/ML IJ SOLN
INTRAMUSCULAR | Status: DC | PRN
  Administered 2018-07-30 (×2): 5 mg via INTRAVENOUS

## 2018-07-30 MED ORDER — OXYCODONE HCL 5 MG PO TABS: 5.0000 mg | ORAL_TABLET | Freq: Once | ORAL | Status: DC | PRN

## 2018-07-30 MED ORDER — BUPIVACAINE LIPOSOME 1.3 % IJ SUSP
INTRAMUSCULAR | Status: AC
  Filled 2018-07-30: qty 20

## 2018-07-30 MED ORDER — LACTATED RINGERS IV SOLN
INTRAVENOUS | Status: DC | PRN
  Administered 2018-07-30: 13 mL

## 2018-07-30 MED ORDER — LIDOCAINE HCL (PF) 1 % IJ SOLN
INTRAMUSCULAR | Status: AC
  Filled 2018-07-30: qty 5

## 2018-07-30 MED ORDER — PHENYLEPHRINE HCL (PRESSORS) 10 MG/ML IV SOLN
INTRAVENOUS | Status: AC
  Filled 2018-07-30: qty 1

## 2018-07-30 MED ORDER — EPINEPHRINE (ANAPHYLAXIS) 30 MG/30ML IJ SOLN
INTRAMUSCULAR | Status: AC
  Filled 2018-07-30: qty 30

## 2018-07-30 MED ORDER — ONDANSETRON 4 MG PO TBDP: 4.0000 mg | ORAL_TABLET | Freq: Three times a day (TID) | ORAL | 0 refills | Status: DC | PRN

## 2018-07-30 MED ORDER — PROPOFOL 10 MG/ML IV BOLUS
INTRAVENOUS | Status: AC
  Filled 2018-07-30: qty 40

## 2018-07-30 MED ORDER — SUCCINYLCHOLINE CHLORIDE 20 MG/ML IJ SOLN
INTRAMUSCULAR | Status: AC
  Filled 2018-07-30: qty 1

## 2018-07-30 MED ORDER — OXYCODONE HCL 5 MG PO TABS: 5.0000 mg | ORAL_TABLET | ORAL | 0 refills | Status: AC | PRN

## 2018-07-30 MED ORDER — DEXAMETHASONE SODIUM PHOSPHATE 10 MG/ML IJ SOLN
INTRAMUSCULAR | Status: DC | PRN
  Administered 2018-07-30: 10 mg via INTRAVENOUS

## 2018-07-30 MED ORDER — BUPIVACAINE HCL (PF) 0.5 % IJ SOLN
INTRAMUSCULAR | Status: AC
  Filled 2018-07-30: qty 10

## 2018-07-30 MED ORDER — FENTANYL CITRATE (PF) 100 MCG/2ML IJ SOLN: 25.0000 ug | INTRAMUSCULAR | Status: DC | PRN

## 2018-07-30 SURGICAL SUPPLY — 82 items
ADAPTER IRRIG TUBE 2 SPIKE SOL (ADAPTER) ×6 IMPLANT
ANCHOR SUT BIOCOMP LK 2.9X12.5 (Anchor) ×3 IMPLANT
BLADE OSCILLATING/SAGITTAL (BLADE)
BLADE SW THK.38XMED LNG THN (BLADE) IMPLANT
BUR BR 5.5 12 FLUTE (BURR) ×3 IMPLANT
BUR RADIUS 4.0X18.5 (BURR) IMPLANT
CANNULA 5.75X7CM (CANNULA)
CANNULA PART THRD DISP 5.75X7 (CANNULA) IMPLANT
CANNULA PARTIAL THREAD 2X7 (CANNULA) ×3 IMPLANT
CANNULA TWIST IN 8.25X9CM (CANNULA) IMPLANT
CHLORAPREP W/TINT 26 (MISCELLANEOUS) ×3 IMPLANT
CLOSURE WOUND 1/2 X4 (GAUZE/BANDAGES/DRESSINGS)
COOLER POLAR GLACIER W/PUMP (MISCELLANEOUS) ×3 IMPLANT
COVER LIGHT HANDLE STERIS (MISCELLANEOUS) ×3 IMPLANT
COVER WAND RF STERILE (DRAPES) ×3 IMPLANT
CRADLE LAMINECT ARM (MISCELLANEOUS) ×6 IMPLANT
DERMABOND ADVANCED (GAUZE/BANDAGES/DRESSINGS)
DERMABOND ADVANCED .7 DNX12 (GAUZE/BANDAGES/DRESSINGS) IMPLANT
DRAPE IMP U-DRAPE 54X76 (DRAPES) ×6 IMPLANT
DRAPE INCISE IOBAN 66X45 STRL (DRAPES) ×3 IMPLANT
DRAPE SHEET LG 3/4 BI-LAMINATE (DRAPES) ×3 IMPLANT
DRAPE STERI 35X30 U-POUCH (DRAPES) ×3 IMPLANT
DRAPE U-SHAPE 47X51 STRL (DRAPES) ×3 IMPLANT
DRSG TEGADERM 4X4.75 (GAUZE/BANDAGES/DRESSINGS) ×9 IMPLANT
ELECT REM PT RETURN 9FT ADLT (ELECTROSURGICAL) ×3
ELECTRODE REM PT RTRN 9FT ADLT (ELECTROSURGICAL) ×1 IMPLANT
GAUZE SPONGE 4X4 12PLY STRL (GAUZE/BANDAGES/DRESSINGS) ×3 IMPLANT
GAUZE XEROFORM 1X8 LF (GAUZE/BANDAGES/DRESSINGS) ×3 IMPLANT
GLOVE BIOGEL PI IND STRL 8 (GLOVE) ×1 IMPLANT
GLOVE BIOGEL PI INDICATOR 8 (GLOVE) ×2
GLOVE SURG SYN 8.0 (GLOVE) ×3 IMPLANT
GOWN STRL REUS W/ TWL LRG LVL3 (GOWN DISPOSABLE) ×1 IMPLANT
GOWN STRL REUS W/TWL LRG LVL3 (GOWN DISPOSABLE) ×2
GOWN STRL REUS W/TWL LRG LVL4 (GOWN DISPOSABLE) ×3 IMPLANT
IMP SYSTEM BRIDGE 4.75X19.1 (Anchor) ×3 IMPLANT
IMPL SYSTEM BRIDGE 4.75X19.1 (Anchor) ×1 IMPLANT
IV LACTATED RINGER IRRG 3000ML (IV SOLUTION) ×34
IV LR IRRIG 3000ML ARTHROMATIC (IV SOLUTION) ×17 IMPLANT
KIT INSERTION 2.9 PUSHLOCK (KITS) ×3 IMPLANT
KIT STABILIZATION SHOULDER (MISCELLANEOUS) ×3 IMPLANT
KIT TURNOVER KIT A (KITS) ×3 IMPLANT
MANIFOLD NEPTUNE II (INSTRUMENTS) ×3 IMPLANT
MASK FACE SPIDER DISP (MASK) ×3 IMPLANT
MAT ABSORB  FLUID 56X50 GRAY (MISCELLANEOUS) ×4
MAT ABSORB FLUID 56X50 GRAY (MISCELLANEOUS) ×2 IMPLANT
NDL SAFETY ECLIPSE 18X1.5 (NEEDLE) ×1 IMPLANT
NEEDLE HYPO 18GX1.5 SHARP (NEEDLE) ×2
NEEDLE HYPO 22GX1.5 SAFETY (NEEDLE) ×3 IMPLANT
NEEDLE MAYO 6 CRC TAPER PT (NEEDLE) IMPLANT
NEEDLE SCORPION MULTI FIRE (NEEDLE) ×3 IMPLANT
PACK ARTHROSCOPY SHOULDER (MISCELLANEOUS) ×3 IMPLANT
PAD ABD DERMACEA PRESS 5X9 (GAUZE/BANDAGES/DRESSINGS) ×3 IMPLANT
PAD WRAPON POLAR SHDR XLG (MISCELLANEOUS) ×1 IMPLANT
SET TUBE SUCT SHAVER OUTFL 24K (TUBING) ×3 IMPLANT
SET TUBE TIP INTRA-ARTICULAR (MISCELLANEOUS) ×3 IMPLANT
SLING ULTRA II M (MISCELLANEOUS) IMPLANT
STAPLER SKIN PROX 35W (STAPLE) IMPLANT
STRAP SAFETY 5IN WIDE (MISCELLANEOUS) ×3 IMPLANT
STRIP CLOSURE SKIN 1/2X4 (GAUZE/BANDAGES/DRESSINGS) IMPLANT
SUT ETHILON 3-0 (SUTURE) ×3 IMPLANT
SUT ETHILON 3-0 FS-10 30 BLK (SUTURE) ×3
SUT ETHILON 4-0 (SUTURE) ×2
SUT ETHILON 4-0 FS2 18XMFL BLK (SUTURE) ×1
SUT LASSO 90 DEG SD STR (SUTURE) IMPLANT
SUT MNCRL 4-0 (SUTURE)
SUT MNCRL 4-0 27XMFL (SUTURE)
SUT PROLENE 0 CT 2 (SUTURE) IMPLANT
SUT TICRON 2-0 30IN 311381 (SUTURE) IMPLANT
SUT VIC AB 0 CT1 36 (SUTURE) IMPLANT
SUT VIC AB 2-0 CT2 27 (SUTURE) IMPLANT
SUT VICRYL 3-0 27IN (SUTURE) IMPLANT
SUTURE EHLN 3-0 FS-10 30 BLK (SUTURE) ×1 IMPLANT
SUTURE ETHLN 4-0 FS2 18XMF BLK (SUTURE) ×1 IMPLANT
SUTURE MNCRL 4-0 27XMF (SUTURE) IMPLANT
SYR 10ML LL (SYRINGE) ×3 IMPLANT
TAPE CLOTH 3X10 WHT NS LF (GAUZE/BANDAGES/DRESSINGS) IMPLANT
TAPE MICROFOAM 4IN (TAPE) ×3 IMPLANT
TUBING ARTHRO INFLOW-ONLY STRL (TUBING) ×3 IMPLANT
TUBING CONNECTING 10 (TUBING) ×2 IMPLANT
TUBING CONNECTING 10' (TUBING) ×1
WAND WEREWOLF FLOW 90D (MISCELLANEOUS) ×3 IMPLANT
WRAPON POLAR PAD SHDR XLG (MISCELLANEOUS) ×3

## 2018-07-30 NOTE — H&P (Signed)
Paper H&P to be scanned into permanent record. H&P reviewed. No significant changes noted.  

## 2018-07-30 NOTE — Anesthesia Post-op Follow-up Note (Signed)
Anesthesia QCDR form completed.        

## 2018-07-30 NOTE — Anesthesia Postprocedure Evaluation (Signed)
Anesthesia Post Note  Patient: Charles Webb  Procedure(s) Performed: SHOULDER ARTHROSCOPY WITH ROTATOR CUFF REPAIR, SUBACROMIAL DECOMPRESSION, BICEPS TENODISIS, RIGHT, SLEEP APNEA (Right )  Patient location during evaluation: PACU Anesthesia Type: General Level of consciousness: awake and alert Pain management: pain level controlled Vital Signs Assessment: post-procedure vital signs reviewed and stable Respiratory status: spontaneous breathing, nonlabored ventilation, respiratory function stable and patient connected to nasal cannula oxygen Cardiovascular status: blood pressure returned to baseline and stable Postop Assessment: no apparent nausea or vomiting Anesthetic complications: no     Last Vitals:  Vitals:   07/30/18 1113 07/30/18 1151  BP: 138/87 136/69  Pulse: 65 (!) 58  Resp: 18 16  Temp: (!) 36.1 C   SpO2: 96% 94%    Last Pain:  Vitals:   07/30/18 1151  TempSrc:   PainSc: 0-No pain                 Precious Haws Piscitello

## 2018-07-30 NOTE — Discharge Instructions (Addendum)
Post-Op Instructions - Rotator Cuff Repair ° °1. Bracing: You will wear a shoulder immobilizer or sling for 6 weeks.  ° °2. Driving: No driving for 3 weeks post-op. When driving, do not wear the immobilizer. Ideally, we recommend no driving for 6 weeks while sling is in place as one arm will be immobilized.  ° °3. Activity: No active lifting for 2 months. Wrist, hand, and elbow motion only. Avoid lifting the upper arm away from the body except for hygiene. You are permitted to bend and straighten the elbow passively only (no active elbow motion). You may use your hand and wrist for typing, writing, and managing utensils (cutting food). Do not lift more than a coffee cup for 8 weeks.  When sleeping or resting, inclined positions (recliner chair or wedge pillow) and a pillow under the forearm for support may provide better comfort for up to 4 weeks.  Avoid long distance travel for 4 weeks. ° °Return to normal activities after rotator cuff repair repair normally takes 6 months on average. If rehab goes very well, may be able to do most activities at 4 months, except overhead or contact sports. ° °4. Physical Therapy: Begins 3-4 days after surgery, and proceed 1 time per week for the first 6 weeks, then 1-2 times per week from weeks 6-20 post-op. ° °5. Medications:  °- You will be provided a prescription for narcotic pain medicine. After surgery, take 1-2 narcotic tablets every 4 hours if needed for severe pain.  °- A prescription for anti-nausea medication will be provided in case the narcotic medicine causes nausea - take 1 tablet every 6 hours only if nauseated.   °- Take tylenol 1000 mg (2 Extra Strength tablets or 3 regular strength) every 8 hours for pain.  May decrease or stop tylenol 5 days after surgery if you are having minimal pain. °- Take ASA 325mg/day x 2 weeks to help prevent DVTs/PEs (blood clots).  °- DO NOT take ANY nonsteroidal anti-inflammatory pain medications (Advil, Motrin, Ibuprofen, Aleve,  Naproxen, or Naprosyn). These medicines can inhibit healing of your shoulder repair.  ° ° °If you are taking prescription medication for anxiety, depression, insomnia, muscle spasm, chronic pain, or for attention deficit disorder, you are advised that you are at a higher risk of adverse effects with use of narcotics post-op, including narcotic addiction/dependence, depressed breathing, death. °If you use non-prescribed substances: alcohol, marijuana, cocaine, heroin, methamphetamines, etc., you are at a higher risk of adverse effects with use of narcotics post-op, including narcotic addiction/dependence, depressed breathing, death. °You are advised that taking > 50 morphine milligram equivalents (MME) of narcotic pain medication per day results in twice the risk of overdose or death. For your prescription provided: oxycodone 5 mg - taking more than 6 tablets per day would result in > 50 morphine milligram equivalents (MME) of narcotic pain medication. °Be advised that we will prescribe narcotics short-term, for acute post-operative pain only - 3 weeks for major operations such as shoulder repair/reconstruction surgeries.  ° ° ° °6. Post-Op Appointment: ° °Your first post-op appointment will be 10-14 days post-op. ° °7. Work or School: For most, but not all procedures, we advise staying out of work or school for at least 1 to 2 weeks in order to recover from the stress of surgery and to allow time for healing.  ° °If you need a work or school note this can be provided.  ° °8. Smoking: If you are a smoker, you need to refrain from   smoking in the postoperative period. The nicotine in cigarettes will inhibit healing of your shoulder repair and decrease the chance of successful repair. Similarly, nicotine containing products (gum, patches) should be avoided.  ° °Post-operative Brace: °Apply and remove the brace you received as you were instructed to at the time of fitting and as described in detail as the brace’s  instructions for use indicate.  Wear the brace for the period of time prescribed by your physician.  The brace can be cleaned with soap and water and allowed to air dry only.  Should the brace result in increased pain, decreased feeling (numbness/tingling), increased swelling or an overall worsening of your medical condition, please contact your doctor immediately.  If an emergency situation occurs as a result of wearing the brace after normal business hours, please dial 911 and seek immediate medical attention.  Let your doctor know if you have any further questions about the brace issued to you. °Refer to the shoulder sling instructions for use if you have any questions regarding the correct fit of your shoulder sling.  °[facility] Customer Care for Troubleshooting: [phone] ° °Video that illustrates how to properly use a shoulder sling: °"Instructions for Proper Use of an Orthopaedic Sling" °https://www.youtube.com/watch?v=AHZpn_Xo45w ° ° ° ° °Interscalene Nerve Block, Care After °This sheet gives you information about how to care for yourself after your procedure. Your health care provider may also give you more specific instructions. If you have problems or questions, contact your health care provider. °What can I expect after the procedure? °After the procedure, it is common to have: °· Soreness or tenderness in your neck. °· Numbness in your shoulder, upper arm, and some fingers. °· Weakness in your shoulder and arm muscles. °The feeling and strength in your shoulder, arm, and fingers should return to normal within hours after your procedure. °Follow these instructions at home: °For at least 24 hours after the procedure: °· Do not: °? Participate in activities in which you could fall or become injured. °? Drive. °? Use heavy machinery. °? Drink alcohol. °? Take sleeping pills or medicines that cause drowsiness. °? Make important decisions or sign legal documents. °? Take care of children on your  own. °· Rest. °Eating and drinking °· If you vomit, drink water, juice, or soup when you can drink without vomiting. °· Make sure you have little or no nausea before eating solid foods. °· Follow the diet that is recommended by your health care provider. °If you have a sling: °· Wear it as told by your health care provider. Remove it only as told by your health care provider. °· Loosen the sling if your fingers tingle, become numb, or turn cold and blue. °· Make sure that your entire arm, including your wrist, is supported. Do not allow your wrist to dangle over the end of the sling. °· Do not let your sling get wet if it is not waterproof. °· Keep the sling clean. °Bathing °· Do not take baths, swim, or use a hot tub until your health care provider approves. °· If you have a nerve block catheter in place, keep the incision site and tubing dry. °Injection site care ° °· Wash your hands with soap and water before you change your bandage (dressing). If soap and water are not available, use hand sanitizer. °· Change your dressing as told by your health care provider. °· Keep your dressing dry. °· Check your nerve block injection site every day for signs of infection. Check for: °?   Redness, swelling, or pain. °? Fluid or blood. °? Warmth. °Activity °· Do not perform complex or risky activities while taking prescription pain medicine and until you have fully recovered. °· Return to your normal activities as told by your health care provider and as you can tolerate them. Ask your health care provider what activities are safe for you. °· Rest and take it easy. This will help you heal and recover more quickly and fully. °· Be very cautious until you have regained strength and sensation. °General instructions °· Have a responsible adult stay with you until you are awake and alert. °· Do not drive or use heavy machinery while taking prescription pain medicine and until you have fully recovered. Ask your health care provider  when it is safe to drive. °· Take over-the-counter and prescription medicines only as told by your health care provider. °· If you smoke, do not smoke without supervision. °· Do not expose your arm or shoulder to very cold or very hot temperatures until you have full feeling back. °· If you have a nerve block catheter in place: °? Try to keep the catheter from getting kinked or pinched. °? Avoid pulling or tugging on the catheter. °· Keep all follow-up visits as told by your health care provider. This is important. °Contact a health care provider if: °· You have chills or fever. °· You have redness, swelling, or pain around your injection site. °· You have fluid or blood coming from the injection site. °· The skin around the injection site is warm to the touch. °· There is a bad smell coming from your dressing. °· You have hoarseness or a drooping or dry eye that lasts more than a few days. °· You have pain that is poorly controlled with the block or with pain medicine. °· You have numbness, tingling, or weakness in your shoulder or arm that lasts for more than one week. °Get help right away if: °· You have severe pain. °· You lose or do not regain strength and sensation in your arm even after the nerve block medicine has stopped. °· You have trouble breathing. °· You have a nerve block catheter still in place and you begin to shiver. °· You have a nerve block catheter still in place and you are getting more and more numb or weak. °This information is not intended to replace advice given to you by your health care provider. Make sure you discuss any questions you have with your health care provider. °Document Released: 01/09/2015 Document Revised: 01/20/2017 Document Reviewed: 09/18/2015 °Elsevier Patient Education © 2020 [employer] ° °AMBULATORY SURGERY  °DISCHARGE INSTRUCTIONS ° ° °1) The drugs that you were given will stay in your system until tomorrow so for the next 24 hours you should not: ° °A) Drive an  automobile °B) Make any legal decisions °C) Drink any alcoholic beverage ° ° °2) You may resume regular meals tomorrow.  Today it is better to start with liquids and gradually work up to solid foods. ° °You may eat anything you prefer, but it is better to start with liquids, then soup and crackers, and gradually work up to solid foods. ° ° °3) Please notify your doctor immediately if you have any unusual bleeding, trouble breathing, redness and pain at the surgery site, drainage, fever, or pain not relieved by medication. ° ° ° °4) Additional Instructions: ° ° ° ° ° ° ° °Please contact your physician with any problems or Same Day Surgery at   [phone], Monday through Friday 6 am to 4 pm, or [place] at [place] [facility] number at [phone]. ° ° °

## 2018-07-30 NOTE — Transfer of Care (Signed)
Immediate Anesthesia Transfer of Care Note  Patient: Charles Webb  Procedure(s) Performed: SHOULDER ARTHROSCOPY WITH MINI OPEN ROTATOR CUFF REPAIR, SUBACROMIAL DECOMPRESSION, BICEPS TENODISIS, RIGHT, SLEEP APNEA (Right )  Patient Location: PACU  Anesthesia Type:General  Level of Consciousness: drowsy  Airway & Oxygen Therapy: Patient Spontanous Breathing and Patient connected to face mask oxygen  Post-op Assessment: Report given to RN and Post -op Vital signs reviewed and stable  Post vital signs: stable  Last Vitals:  Vitals Value Taken Time  BP 127/74 07/30/18 1026  Temp    Pulse 59 07/30/18 1034  Resp 18 07/30/18 1034  SpO2 99 % 07/30/18 1034  Vitals shown include unvalidated device data.  Last Pain:  Vitals:   07/30/18 1027  TempSrc:   PainSc: (P) Asleep         Complications: No apparent anesthesia complications

## 2018-07-30 NOTE — Op Note (Addendum)
SURGERY DATE: 07/30/2018   PRE-OP DIAGNOSIS:  1. Right subacromial impingement 2. Right biceps tendinopathy 3. Right rotator cuff tear  POST-OP DIAGNOSIS: 1. Right subacromial impingement 2. Right biceps tendinopathy 3. Right rotator cuff tear  PROCEDURES:  1. Right arthroscopic rotator cuff repair 2. Right arthroscopic biceps tenodesis 3. Right subacromial decompression 4. Right extensive debridement of shoulder (glenohumeral and subacromial spaces)  SURGEON: Cato Mulligan, MD  ASSISTANT: Anitra Lauth, PA  ANESTHESIA: Gen with postoperative interscalene block  ESTIMATED BLOOD LOSS: 5cc  DRAINS:  none  TOTAL IV FLUIDS: per anesthesia   SPECIMENS: none  IMPLANTS:   - Arthrex 2.72m Pushlock - x1 - Arthrex 4.716mSwiveLock - x4 (speed bridge kit)   OPERATIVE FINDINGS:  Examination under anesthesia: A careful examination under anesthesia was performed.  Passive range of motion was: FF: 160; ER at side: 45; ER in abduction: 90; IR in abduction: 45.  Anterior load shift: NT.  Posterior load shift: NT.  Sulcus in neutral: NT.  Sulcus in ER: NT.    Intra-operative findings: A thorough arthroscopic examination of the shoulder was performed.  The findings are: 1. Biceps tendon: tendinopathy with significant erythema biceps anchor fraying at the biceps anchor 2. Superior labrum: Normal 3. Posterior labrum and capsule: normal 4. Inferior capsule and inferior recess: normal 5. Glenoid cartilage surface: Normal except mild fraying posteriorly 6. Supraspinatus attachment: Full-thickness tear of the anterior supraspinatus 7. Posterior rotator cuff attachment: normal 8. Humeral head articular cartilage: normal 9. Rotator interval: Significant synovitis 10: Subscapularis tendon: attachment intact 11. Anterior labrum: Mild degeneration 12. IGHL: normal  OPERATIVE REPORT:   Indications for procedure: Charles Webb is a [age].o. year old male who Initially had an injury on  04/23/2018 where he fell onto his shoulder from a height of approximately 7 feet.  He sustained fractured rib at that point that has since healed appropriately.  He underwent a course of medical management and physical therapy without improvement in his symptoms.  Clinical exam and MRI were concerning for full-thickness rotator cuff tear, biceps tendinopathy, and subacromial impingement. After discussion of risks, benefits, and alternatives to surgery, the patient elected to proceed.    Procedure in detail: I identified Charles Webb in the pre-operative holding area.  I marked the operative shoulder with my initials. I reviewed the risks and benefits of the proposed surgical intervention, and the patient (and/or patient's guardian) wished to proceed.  Anesthesia was then performed with an interscalene block.  The patient was transferred to the operative suite and placed in the beach chair position.    SCDs were placed on the lower extremities. Appropriate IV antibiotics were administered within 1 hour before incision. The operative upper extremity was then prepped and draped in standard fashion. A time out was performed confirming the correct extremity, correct patient and correct procedure.   I then created a standard posterior portal with an 11 blade. The glenohumeral joint was easily entered with a blunt trochar and the arthroscope introduced. The findings of diagnostic arthroscopy are described above. I debrided degenerative tissue including labrum and cartilaginous surfaces and also debrided and coagulated the inflamed synovium to obtain hemostasis and reduce the risk of post-operative swelling using an Arthrocare radiofrequency device.   I then turned my attention to the arthroscopic biceps tenodesis.  I used the loop n tack technique to pass a fiber tape through the biceps in a locked fashion adjacent to the biceps anchor.  A hole for a 2.9 mm  Arthrex PushLock was drilled in the bicipital  groove just superior to the subscapularis tendon insertion.  The fiber tape was loaded onto the push lock anchor and impacted into place into the previously drilled hole in the bicipital groove.  This appropriately secured the biceps into the bicipital groove and took it off of tension.  Next, the arthroscope was then introduced into the subacromial space. A direct lateral portal was created with an 11-blade after spinal needle localization. An extensive subacromial bursectomy was performed using a combination of the shaver and Arthrocare wand. The entire acromial undersurface was exposed and the CA ligament was subperiosteally elevated to expose the anterior acromial hook. A burr was used to create a flat anterior and lateral aspect of the acromion, converting it from a Type 2 to a Type 1 acromion. Care was made to keep the deltoid fascia intact.  Then, I created 2 accessory anterolateral portals to assist with visualization and instrumentation.  I debrided the poor quality edges of the supraspinatus tendon.  This was a U-shaped tear of the anterior supraspinatus.  I prepared the footprint using a burr to expose bleeding bone.   I then percutaneously placed two medial row anchors. These were placed at the anterior and posterior borders of the tear, at the articular margin. I then shuttled the combined strand of tape from each anchor through the rotator cuff just lateral to the musculotendinous junction using an I then took one tape limb from each of the 2 tapes in each anchor and fixed them 1 cm distal to the tip of the greater tuberosity in line with the anteromedial and posteromedial anchors to create a crossed, double row suture configuration. Lateral row fixation was obtained with two 4.75 mm Arthrex SwiveLock anchors with tapes placed under appropriate tension.. This afforded appropriate reduction of the rotator cuff tear with homogeneous compression of the tendon across the prepared footprint.    Fluid was evacuated from the shoulder, and the portals were closed with 3-0 Nylon. Xeroform was applied to the portals. A sterile dressing was applied, followed by a Polar Care sleeve and a SlingShot shoulder immobilizer/sling. The patient awoke from anesthesia without difficulty and was transferred to the PACU in stable condition.     COMPLICATIONS: none  DISPOSITION: plan for discharge home after recovery in PACU   POSTOPERATIVE PLAN: Remain in sling (except hygiene and elbow/wrist/hand RoM exercises as instructed by PT) x 6 weeks and NWB for this time. PT to begin 3-4 days after surgery. Rotator cuff repair and biceps tenodesis rehab protocol.

## 2018-07-30 NOTE — Anesthesia Procedure Notes (Signed)
Anesthesia Regional Block: Interscalene brachial plexus block   Pre-Anesthetic Checklist: ,, timeout performed, Correct Patient, Correct Site, Correct Laterality, Correct Procedure, Correct Position, site marked, Risks and benefits discussed,  Surgical consent,  Pre-op evaluation,  At surgeon's request and post-op pain management  Laterality: Upper and Right  Prep: chloraprep       Needles:  Injection technique: Single-shot  Needle Type: Stimiplex     Needle Length: 5cm  Needle Gauge: 22     Additional Needles:   Procedures:,,,, ultrasound used (permanent image in chart),,,,  Narrative:  Start time: 07/30/2018 7:15 AM End time: 07/30/2018 7:21 AM Injection made incrementally with aspirations every 5 mL.  Performed by: Personally  Anesthesiologist: Andrell Tallman, Precious Haws, MD  Additional Notes: Patient consented for risk and benefits of nerve block including but not limited to nerve damage, failed block, bleeding and infection.  Patient voiced understanding.  Functioning IV was confirmed and monitors were applied.  A 65mm 22ga Stimuplex needle was used. Sterile prep,hand hygiene and sterile gloves were used.  Minimal sedation used for procedure.  No paresthesia endorsed by patient during the procedure.  Negative aspiration and negative test dose prior to incremental administration of local anesthetic. The patient tolerated the procedure well with no immediate complications.

## 2018-07-30 NOTE — Anesthesia Procedure Notes (Signed)
Procedure Name: Intubation Date/Time: 07/30/2018 7:46 AM Performed by: Lavone Orn, CRNA Pre-anesthesia Checklist: Patient identified, Emergency Drugs available, Suction available, Patient being monitored and Timeout performed Patient Re-evaluated:Patient Re-evaluated prior to induction Oxygen Delivery Method: Circle system utilized Preoxygenation: Pre-oxygenation with 100% oxygen Induction Type: IV induction Ventilation: Mask ventilation without difficulty and Oral airway inserted - appropriate to patient size Laryngoscope Size: McGraph and 4 Grade View: Grade I Tube type: Oral Tube size: 7.5 mm Number of attempts: 1 Airway Equipment and Method: Stylet and Video-laryngoscopy Placement Confirmation: ETT inserted through vocal cords under direct vision,  positive ETCO2 and breath sounds checked- equal and bilateral Secured at: 21 cm Tube secured with: Tape Dental Injury: Teeth and Oropharynx as per pre-operative assessment

## 2018-07-30 NOTE — Anesthesia Preprocedure Evaluation (Signed)
Anesthesia Evaluation  Patient identified by MRN, date of birth, ID band Patient awake    Reviewed: Allergy & Precautions, H&P , NPO status , Patient's Chart, lab work & pertinent test results  History of Anesthesia Complications Negative for: history of anesthetic complications  Airway Mallampati: III  TM Distance: <3 FB Neck ROM: limited    Dental  (+) Chipped   Pulmonary neg shortness of breath, sleep apnea and Continuous Positive Airway Pressure Ventilation ,           Cardiovascular Exercise Tolerance: Good (-) angina(-) Past MI and (-) DOE negative cardio ROS       Neuro/Psych negative neurological ROS  negative psych ROS   GI/Hepatic negative GI ROS, Neg liver ROS, neg GERD  ,  Endo/Other  negative endocrine ROS  Renal/GU      Musculoskeletal   Abdominal   Peds  Hematology negative hematology ROS (+)   Anesthesia Other Findings Past Medical History: No date: Anxiety No date: Sleep apnea  Past Surgical History: No date: HERNIA REPAIR     Comment:  hiatal No date: vascetomy  BMI    Body Mass Index: 42.24 kg/m      Reproductive/Obstetrics negative OB ROS                             Anesthesia Physical Anesthesia Plan  ASA: III  Anesthesia Plan: General ETT   Post-op Pain Management: GA combined w/ Regional for post-op pain   Induction: Intravenous  PONV Risk Score and Plan: Ondansetron, Dexamethasone, Midazolam and Treatment may vary due to age or medical condition  Airway Management Planned: Oral ETT and Video Laryngoscope Planned  Additional Equipment:   Intra-op Plan:   Post-operative Plan: Extubation in OR  Informed Consent: I have reviewed the patients History and Physical, chart, labs and discussed the procedure including the risks, benefits and alternatives for the proposed anesthesia with the patient or authorized representative who has indicated  his/her understanding and acceptance.     Dental Advisory Given  Plan Discussed with: Anesthesiologist, CRNA and Surgeon  Anesthesia Plan Comments: (Patient consented for risks of anesthesia including but not limited to:  - adverse reactions to medications - damage to teeth, lips or other oral mucosa - sore throat or hoarseness - Damage to heart, brain, lungs or loss of life  Patient voiced understanding.)        Anesthesia Quick Evaluation

## 2019-01-03 DIAGNOSIS — U071 COVID-19: Secondary | ICD-10-CM | POA: Insufficient documentation

## 2019-04-25 ENCOUNTER — Ambulatory Visit: Payer: 59 | Attending: Internal Medicine

## 2019-04-25 DIAGNOSIS — Z23 Encounter for immunization: Secondary | ICD-10-CM

## 2019-04-25 NOTE — Progress Notes (Signed)
   Covid-19 Vaccination Clinic  Name:  Charles Webb    MRN: [id number] DOB: Nov 28, 1970  04/25/2019  Charles Webb was observed post Covid-19 immunization for 15 minutes without incident. He was provided with Vaccine Information Sheet and instruction to access the V-Safe system.   Charles Webb was instructed to call 911 with any severe reactions post vaccine: Marland Kitchen Difficulty breathing  . Swelling of face and throat  . A fast heartbeat  . A bad rash all over body  . Dizziness and weakness   Immunizations Administered    Name Date Dose VIS Date Route   Pfizer COVID-19 Vaccine 04/25/2019  8:32 AM 0.3 mL 01/11/2019 Intramuscular   Manufacturer: [employer], [employer]   Lot: [id number]   NDC: [address]-0

## 2019-05-22 ENCOUNTER — Ambulatory Visit: Payer: 59 | Attending: Internal Medicine

## 2019-05-22 DIAGNOSIS — Z23 Encounter for immunization: Secondary | ICD-10-CM

## 2019-05-22 NOTE — Progress Notes (Signed)
   Covid-19 Vaccination Clinic  Name:  Charles Webb    MRN: [id number] DOB: 1970-03-30  05/22/2019  [name] was observed post Covid-19 immunization for 15 minutes without incident. He was provided with Vaccine Information Sheet and instruction to access the V-Safe system.   [name] was instructed to call 911 with any severe reactions post vaccine: Marland Kitchen Difficulty breathing  . Swelling of face and throat  . A fast heartbeat  . A bad rash all over body  . Dizziness and weakness   Immunizations Administered    Name Date Dose VIS Date Route   Pfizer COVID-19 Vaccine 05/22/2019  8:24 AM 0.3 mL 03/27/2018 Intramuscular   Manufacturer: [employer], [employer]   Lot: [id number]   NDC: [address]-7

## 2020-01-30 ENCOUNTER — Other Ambulatory Visit: Payer: Self-pay

## 2020-01-30 ENCOUNTER — Ambulatory Visit
Admission: EM | Admit: 2020-01-30 | Discharge: 2020-01-30 | Disposition: A | Discharge status: Home / Self Care | Payer: BC Managed Care – PPO | Attending: Physician Assistant | Admitting: Physician Assistant

## 2020-01-30 ENCOUNTER — Ambulatory Visit: Payer: Self-pay

## 2020-01-30 ENCOUNTER — Encounter: Payer: Self-pay | Admitting: Emergency Medicine

## 2020-01-30 DIAGNOSIS — F419 Anxiety disorder, unspecified: Secondary | ICD-10-CM | POA: Diagnosis not present

## 2020-01-30 DIAGNOSIS — Z7901 Long term (current) use of anticoagulants: Secondary | ICD-10-CM | POA: Diagnosis not present

## 2020-01-30 DIAGNOSIS — Z79899 Other long term (current) drug therapy: Secondary | ICD-10-CM | POA: Diagnosis not present

## 2020-01-30 DIAGNOSIS — R059 Cough, unspecified: Secondary | ICD-10-CM

## 2020-01-30 DIAGNOSIS — Z20822 Contact with and (suspected) exposure to covid-19: Secondary | ICD-10-CM | POA: Diagnosis not present

## 2020-01-30 DIAGNOSIS — I10 Essential (primary) hypertension: Secondary | ICD-10-CM | POA: Insufficient documentation

## 2020-01-30 DIAGNOSIS — J209 Acute bronchitis, unspecified: Secondary | ICD-10-CM

## 2020-01-30 DIAGNOSIS — J019 Acute sinusitis, unspecified: Secondary | ICD-10-CM | POA: Diagnosis not present

## 2020-01-30 HISTORY — DX: Essential (primary) hypertension: I10

## 2020-01-30 LAB — RESP PANEL BY RT-PCR (FLU A&B, COVID) ARPGX2
Influenza A by PCR: NEGATIVE
Influenza B by PCR: NEGATIVE
SARS Coronavirus 2 by RT PCR: NEGATIVE

## 2020-01-30 MED ORDER — AMOXICILLIN-POT CLAVULANATE 875-125 MG PO TABS: 1.0000 | ORAL_TABLET | Freq: Two times a day (BID) | ORAL | 0 refills | Status: AC

## 2020-01-30 NOTE — ED Provider Notes (Signed)
[facility]    CSN: [id number] Arrival date & time: 01/30/20  1008      History   Chief Complaint Chief Complaint  Patient presents with   Nasal Congestion   Cough   Fatigue    HPI Charles Webb is a 49 y.o. male presenting for 4 to 5-day of cough, nasal congestion, and fatigue. Patient states that he has been feeling worse in the past couple days. He denies any known Covid exposure and has been fully vaccinated for COVID-19.  Slightly had a cold for about a week and then that seemed to get better.  He says that he was well for about 4 to 5 days and then his symptoms started.  He admits to sinus pain and pressure and chest congestion.  States that his mucus is yellow-green.  Denies any associated fevers or breathing difficulty.  No vomiting or diarrhea.  Denies any known Covid or flu exposure.  Has been taking over-the-counter Coricidin HBP. PMH significant for hypertension, sleep apnea, and anxiety.  No other concerns.  HPI  Past Medical History:  Diagnosis Date   Anxiety    Hypertension    Sleep apnea     There are no problems to display for this patient.   Past Surgical History:  Procedure Laterality Date   HERNIA REPAIR     hiatal   SHOULDER ARTHROSCOPY WITH SUBACROMIAL DECOMPRESSION, ROTATOR CUFF REPAIR AND BICEP TENDON REPAIR Right 07/30/2018   Procedure: SHOULDER ARTHROSCOPY WITH ROTATOR CUFF REPAIR, SUBACROMIAL DECOMPRESSION, BICEPS TENODISIS, RIGHT, SLEEP APNEA;  Surgeon: Signa Kell, MD;  Location: [facility] ORS;  Service: Orthopedics;  Laterality: Right;   vascetomy         Home Medications    Prior to Admission medications   Medication Sig Start Date End Date Taking? Authorizing Provider  amoxicillin-clavulanate (AUGMENTIN) 875-125 MG tablet Take 1 tablet by mouth every 12 (twelve) hours for 7 days. 01/30/20 02/06/20 Yes Eusebio Friendly B, PA-C  LORazepam (ATIVAN) 0.5 MG tablet Take 0.5 mg by mouth 2 (two) times daily as needed for  anxiety. 07/09/19  Yes [provider]  losartan-hydrochlorothiazide (HYZAAR) 100-12.5 MG tablet Take 1 tablet by mouth daily. 01/19/20  Yes [provider]  UNABLE TO FIND Betahistine (Serc) 16 mg PO TID 08/06/19  Yes [provider]  venlafaxine XR (EFFEXOR-XR) 75 MG 24 hr capsule Take 75 mg by mouth daily. 05/08/18  Yes [provider]  ondansetron (ZOFRAN ODT) 4 MG disintegrating tablet Take 1 tablet (4 mg total) by mouth every 8 (eight) hours as needed for nausea or vomiting. 07/30/18   Signa Kell, MD    Family History Family History  Problem Relation Age of Onset   Heart failure Father    Healthy Mother     Social History Social History   Tobacco Use   Smoking status: Never Smoker   Smokeless tobacco: Never Used  Building services engineer Use: Never used  Substance Use Topics   Alcohol use: Yes    Comment: rarely   Drug use: No     Allergies   Patient has no known allergies.   Review of Systems Review of Systems  Constitutional: Positive for fatigue. Negative for fever.  HENT: Positive for congestion, rhinorrhea, sinus pressure and sinus pain. Negative for sore throat.   Respiratory: Positive for cough. Negative for shortness of breath and wheezing.   Cardiovascular: Negative for chest pain.  Gastrointestinal: Negative for abdominal pain, diarrhea, nausea and vomiting.  Musculoskeletal:  Negative for myalgias.  Neurological: Negative for weakness, light-headedness and headaches.  Hematological: Negative for adenopathy.     Physical Exam Triage Vital Signs ED Triage Vitals  Enc Vitals Group     BP 01/30/20 1145 132/81     Pulse Rate 01/30/20 1145 68     Resp 01/30/20 1145 18     Temp 01/30/20 1145 98.5 F (36.9 C)     Temp Source 01/30/20 1145 Oral     SpO2 01/30/20 1145 99 %     Weight 01/30/20 1144 285 lb (129.3 kg)     Height 01/30/20 1144 5\' 10"  (1.778 m)     Head Circumference --      Peak Flow --      Pain Score  01/30/20 1144 0     Pain Loc --      Pain Edu? --      Excl. in GC? --    No data found.  Updated Vital Signs BP 132/81 (BP Location: Left Arm)    Pulse 68    Temp 98.5 F (36.9 C) (Oral)    Resp 18    Ht 5\' 10"  (1.778 m)    Wt 285 lb (129.3 kg)    SpO2 99%    BMI 40.89 kg/m    Physical Exam Vitals and nursing note reviewed.  Constitutional:      General: He is not in acute distress.    Appearance: Normal appearance. He is well-developed and well-nourished. He is not ill-appearing or diaphoretic.  HENT:     Head: Normocephalic and atraumatic.     Nose: Congestion and rhinorrhea present.     Mouth/Throat:     Mouth: Oropharynx is clear and moist and mucous membranes are normal.     Pharynx: Uvula midline. No oropharyngeal exudate.     Tonsils: No tonsillar abscesses.  Eyes:     General: No scleral icterus.       Right eye: No discharge.        Left eye: No discharge.     Extraocular Movements: EOM normal.     Conjunctiva/sclera: Conjunctivae normal.     Pupils: Pupils are equal, round, and reactive to light.  Neck:     Thyroid: No thyromegaly.     Trachea: No tracheal deviation.  Cardiovascular:     Rate and Rhythm: Normal rate and regular rhythm.     Heart sounds: Normal heart sounds.  Pulmonary:     Effort: Pulmonary effort is normal. No respiratory distress.     Breath sounds: Rhonchi (diffuse mild rhonchi throughout) present. No wheezing or rales.  Musculoskeletal:     Cervical back: Normal range of motion and neck supple.  Lymphadenopathy:     Cervical: No cervical adenopathy.  Skin:    General: Skin is warm and dry.     Findings: No rash.  Neurological:     General: No focal deficit present.     Mental Status: He is alert. Mental status is at baseline.     Motor: No weakness.  Psychiatric:        Mood and Affect: Mood normal.        Behavior: Behavior normal.        Thought Content: Thought content normal.      UC Treatments / Results  Labs (all labs  ordered are listed, but only abnormal results are displayed) Labs Reviewed  RESP PANEL BY RT-PCR (FLU A&B, COVID) [id number]    EKG   Radiology No  results found.  Procedures Procedures (including critical care time)  Medications Ordered in UC Medications - No data to display  Initial Impression / Assessment and Plan / UC Course  I have reviewed the triage vital signs and the nursing notes.  Pertinent labs & imaging results that were available during my care of the patient were reviewed by me and considered in my medical decision making (see chart for details).   Negative respiratory panel.  Discussed chills with patient.  Suspect secondary sinusitis and bronchitis.  Since he was feeling better after cold and then got worse, believe this is secondary sickening.  Treating with Augmentin at this time.  Advised continue Coricidin HBP and supportive care.  Return and ED precautions reviewed with patient.   Final Clinical Impressions(s) / UC Diagnoses   Final diagnoses:  Acute bronchitis, unspecified organism  Acute sinusitis, recurrence not specified, unspecified location  Cough     Discharge Instructions     Your flu and Covid tests are both negative.  I suspect the head cold that you had started to bronchitis.  This is likely a viral infection and may take a couple weeks for you to improve.  Continue supportive care with increasing rest and fluid intake.  Continue Coricidin HBP.  I have sent antibiotic if not feeling better over the next few days or if you get a fever, worsening cough or have any breathing problem.  If you feel acutely worse, please return for reexamination.    ED Prescriptions    Medication Sig Dispense Auth. Provider   amoxicillin-clavulanate (AUGMENTIN) 875-125 MG tablet Take 1 tablet by mouth every 12 (twelve) hours for 7 days. 14 tablet Gareth Morgan     PDMP not reviewed this encounter.   Shirlee Latch, PA-C 01/30/20 1304

## 2020-01-30 NOTE — Discharge Instructions (Signed)
Your flu and Covid tests are both negative.  I suspect the head cold that you had started to bronchitis.  This is likely a viral infection and may take a couple weeks for you to improve.  Continue supportive care with increasing rest and fluid intake.  Continue Coricidin HBP.  I have sent antibiotic if not feeling better over the next few days or if you get a fever, worsening cough or have any breathing problem.  If you feel acutely worse, please return for reexamination.

## 2020-01-30 NOTE — ED Triage Notes (Signed)
Patient in today c/o 4-5 day history of cough, nasal congestion and fatigue. Patient states worse in the last couple of days. Patient has had the covid vaccines.

## 2020-06-14 ENCOUNTER — Ambulatory Visit
Admission: RE | Admit: 2020-06-14 | Discharge: 2020-06-14 | Disposition: A | Discharge status: Home / Self Care | Payer: BC Managed Care – PPO | Source: Ambulatory Visit | Attending: Physician Assistant | Admitting: Physician Assistant

## 2020-06-14 ENCOUNTER — Other Ambulatory Visit: Payer: Self-pay

## 2020-06-14 VITALS — BP 135/79 | HR 69 | Temp 98.3°F | Resp 16 | Ht 70.0 in | Wt 285.0 lb

## 2020-06-14 DIAGNOSIS — R059 Cough, unspecified: Secondary | ICD-10-CM

## 2020-06-14 DIAGNOSIS — Z79899 Other long term (current) drug therapy: Secondary | ICD-10-CM | POA: Diagnosis not present

## 2020-06-14 DIAGNOSIS — I10 Essential (primary) hypertension: Secondary | ICD-10-CM | POA: Diagnosis not present

## 2020-06-14 DIAGNOSIS — Z20822 Contact with and (suspected) exposure to covid-19: Secondary | ICD-10-CM | POA: Insufficient documentation

## 2020-06-14 DIAGNOSIS — J019 Acute sinusitis, unspecified: Secondary | ICD-10-CM

## 2020-06-14 DIAGNOSIS — R0981 Nasal congestion: Secondary | ICD-10-CM

## 2020-06-14 MED ORDER — AMOXICILLIN-POT CLAVULANATE 875-125 MG PO TABS: 1.0000 | ORAL_TABLET | Freq: Two times a day (BID) | ORAL | 0 refills | Status: AC

## 2020-06-14 NOTE — ED Provider Notes (Signed)
[facility]    CSN: [id number] Arrival date & time: 06/14/20  1404      History   Chief Complaint Chief Complaint  Patient presents with  . Nasal Congestion    APPOINTMENT  . Sinus Problem    HPI Charles Webb Kidney is a 50 y.o. male presenting for approximately 10-day history of nasal congestion, productive cough and sinus pressure.  Patient says that he was feeling better until a couple days ago when his nasal drainage started to become discolored.  Patient has increased fatigue.  He says that his wife and son had similar symptoms but they got better and he has gotten worse.  Patient denies any fevers.  He has been fatigued.  He denies any chest pain or breathing difficulty.  Has been taking over-the-counter Coricidin HBP for his cough.  Patient denies any known exposure to COVID-19.  History significant for hypertension and sleep apnea.  Patient has no other complaints or concerns today.  HPI  Past Medical History:  Diagnosis Date  . Anxiety   . Hypertension   . Sleep apnea     There are no problems to display for this patient.   Past Surgical History:  Procedure Laterality Date  . HERNIA REPAIR     hiatal  . SHOULDER ARTHROSCOPY WITH SUBACROMIAL DECOMPRESSION, ROTATOR CUFF REPAIR AND BICEP TENDON REPAIR Right 07/30/2018   Procedure: SHOULDER ARTHROSCOPY WITH ROTATOR CUFF REPAIR, SUBACROMIAL DECOMPRESSION, BICEPS TENODISIS, RIGHT, SLEEP APNEA;  Surgeon: Signa Kell, MD;  Location: [facility] OR[facility];  Service: Orthopedics;  Laterality: Right;  . vascetomy         Home Medications    Prior to Admission medications   Medication Sig Start Date End Date Taking? Authorizing Provider  amoxicillin-clavulanate (AUGMENTIN) 875-125 MG tablet Take 1 tablet by mouth every 12 (twelve) hours for 7 days. 06/14/20 06/21/20 Yes Eusebio Friendly B, PA-C  LORazepam (ATIVAN) 0.5 MG tablet Take 0.5 mg by mouth 2 (two) times daily as needed for anxiety. 07/09/19  Yes [provider]   losartan-hydrochlorothiazide (HYZAAR) 100-12.5 MG tablet Take 1 tablet by mouth daily. 01/19/20  Yes [provider]  venlafaxine XR (EFFEXOR-XR) 75 MG 24 hr capsule Take 75 mg by mouth daily. 05/08/18  Yes [provider]  ondansetron (ZOFRAN ODT) 4 MG disintegrating tablet Take 1 tablet (4 mg total) by mouth every 8 (eight) hours as needed for nausea or vomiting. 07/30/18   Signa Kell, MD  UNABLE TO FIND Betahistine (Serc) 16 mg PO TID 08/06/19   [provider]    Family History Family History  Problem Relation Age of Onset  . Heart failure Father   . Healthy Mother     Social History Social History   Tobacco Use  . Smoking status: Never Smoker  . Smokeless tobacco: Never Used  Vaping Use  . Vaping Use: Never used  Substance Use Topics  . Alcohol use: Yes    Comment: rarely  . Drug use: No     Allergies   Patient has no known allergies.   Review of Systems Review of Systems  Constitutional: Negative for fatigue and fever.  HENT: Positive for congestion, rhinorrhea, sinus pressure and sinus pain. Negative for sore throat.   Respiratory: Positive for cough. Negative for shortness of breath.   Gastrointestinal: Negative for abdominal pain, diarrhea, nausea and vomiting.  Musculoskeletal: Negative for myalgias.  Neurological: Negative for weakness, light-headedness and headaches.  Hematological: Negative for adenopathy.     Physical Exam Triage  Vital Signs ED Triage Vitals  Enc Vitals Group     BP 06/14/20 1449 135/79     Pulse Rate 06/14/20 1449 69     Resp 06/14/20 1449 16     Temp 06/14/20 1449 98.3 F (36.8 C)     Temp Source 06/14/20 1449 Oral     SpO2 06/14/20 1449 97 %     Weight 06/14/20 1446 285 lb (129.3 kg)     Height 06/14/20 1446 5\' 10"  (1.778 m)     Head Circumference --      Peak Flow --      Pain Score 06/14/20 1446 0     Pain Loc --      Pain Edu? --      Excl. in GC? --    No data found.  Updated Vital  Signs BP 135/79 (BP Location: Left Arm)   Pulse 69   Temp 98.3 F (36.8 C) (Oral)   Resp 16   Ht 5\' 10"  (1.778 m)   Wt 285 lb (129.3 kg)   SpO2 97%   BMI 40.89 kg/m       Physical Exam Vitals and nursing note reviewed.  Constitutional:      General: He is not in acute distress.    Appearance: Normal appearance. He is well-developed. He is not ill-appearing or diaphoretic.  HENT:     Head: Normocephalic and atraumatic.     Right Ear: Tympanic membrane, ear canal and external ear normal.     Left Ear: Tympanic membrane, ear canal and external ear normal.     Nose: Congestion and rhinorrhea present.     Mouth/Throat:     Mouth: Mucous membranes are moist.     Pharynx: Oropharynx is clear. Uvula midline. Posterior oropharyngeal erythema present. No oropharyngeal exudate.     Tonsils: No tonsillar abscesses.  Eyes:     General: No scleral icterus.       Right eye: No discharge.        Left eye: No discharge.     Conjunctiva/sclera: Conjunctivae normal.  Neck:     Thyroid: No thyromegaly.     Trachea: No tracheal deviation.  Cardiovascular:     Rate and Rhythm: Normal rate and regular rhythm.     Heart sounds: Normal heart sounds.  Pulmonary:     Effort: Pulmonary effort is normal. No respiratory distress.     Breath sounds: Normal breath sounds. No wheezing, rhonchi or rales.  Musculoskeletal:     Cervical back: Normal range of motion and neck supple.  Lymphadenopathy:     Cervical: No cervical adenopathy.  Skin:    General: Skin is warm and dry.     Findings: No rash.  Neurological:     General: No focal deficit present.     Mental Status: He is alert. Mental status is at baseline.     Motor: No weakness.     Gait: Gait normal.  Psychiatric:        Mood and Affect: Mood normal.        Behavior: Behavior normal.        Thought Content: Thought content normal.      UC Treatments / Results  Labs (all labs ordered are listed, but only abnormal results are  displayed) Labs Reviewed  SARS CORONAVIRUS 2 (TAT 6-24 HRS)    EKG   Radiology No results found.  Procedures Procedures (including critical care time)  Medications Ordered in UC Medications - No data  to display  Initial Impression / Assessment and Plan / UC Course  I have reviewed the triage vital signs and the nursing notes.  Pertinent labs & imaging results that were available during my care of the patient were reviewed by me and considered in my medical decision making (see chart for details).    50 year old male presenting for 10-day history of cough, congestion and sinus pressure.  Symptoms worsening over the past couple of days with change in nasal drainage.  COVID test obtained today.  Current CDC count, isolation protocol and ED precautions reviewed.  Given duration of symptoms, reasonable to treat at this time for possible secondary bacterial sinus infection with Augmentin.  Advised him to continue with the OTC decongestants as well.  Reviewed when to return and when to go to ED.   Final Clinical Impressions(s) / UC Diagnoses   Final diagnoses:  Acute sinusitis, recurrence not specified, unspecified location  Cough  Nasal congestion     Discharge Instructions     We discussed the fact that still most likely viral illness.  Sometimes viruses can last for a couple of weeks especially if they move into your chest.  Since you have had symptoms ongoing for the past 10 days and some of them are worsening, can try an antibiotic at this time to cover you for atypical bacterial causes.  Continue with the over-the-counter Coricidin HBP, rest and fluids.  If you develop a fever, worsening cough, shortness of breath or chest pain you need to be seen again.    ED Prescriptions    Medication Sig Dispense Auth. Provider   amoxicillin-clavulanate (AUGMENTIN) 875-125 MG tablet Take 1 tablet by mouth every 12 (twelve) hours for 7 days. 14 tablet Gareth Morgan     PDMP not  reviewed this encounter.   Shirlee Latch, PA-C 06/14/20 1514

## 2020-06-14 NOTE — Discharge Instructions (Signed)
We discussed the fact that still most likely viral illness.  Sometimes viruses can last for a couple of weeks especially if they move into your chest.  Since you have had symptoms ongoing for the past 10 days and some of them are worsening, can try an antibiotic at this time to cover you for atypical bacterial causes.  Continue with the over-the-counter Coricidin HBP, rest and fluids.  If you develop a fever, worsening cough, shortness of breath or chest pain you need to be seen again.

## 2020-06-14 NOTE — ED Triage Notes (Signed)
Patient c/o nasal congestion and sinus drainage that started a week ago.  Patient c/o cough and congestion for a week.  Patient denies fevers.

## 2020-06-15 LAB — SARS CORONAVIRUS 2 (TAT 6-24 HRS): SARS Coronavirus 2: NEGATIVE

## 2020-08-26 DIAGNOSIS — M79645 Pain in left finger(s): Secondary | ICD-10-CM | POA: Insufficient documentation

## 2020-08-26 DIAGNOSIS — S66419A Strain of intrinsic muscle, fascia and tendon of unspecified thumb at wrist and hand level, initial encounter: Secondary | ICD-10-CM | POA: Insufficient documentation

## 2020-09-21 DIAGNOSIS — I10 Essential (primary) hypertension: Secondary | ICD-10-CM | POA: Insufficient documentation

## 2020-09-21 DIAGNOSIS — F418 Other specified anxiety disorders: Secondary | ICD-10-CM | POA: Insufficient documentation

## 2021-02-26 IMAGING — MR MRI OF THE RIGHT SHOULDER WITHOUT CONTRAST
5 series · 32 of 40 positions shown · non-contrast
Comparison: None.

CLINICAL DATA: Right shoulder pain for 2 months

EXAM:
MRI OF THE RIGHT SHOULDER WITHOUT CONTRAST
TECHNIQUE: Multiplanar, multisequence MR imaging of the shoulder was performed.
No intravenous contrast was administered.

[Series 5: PD fat-sat · axial · right · 4.0mm · 0.55mm/px · z∈[-61,+69]mm · 8 of 28 slices shown (1 of 2)]
[im 1/28]
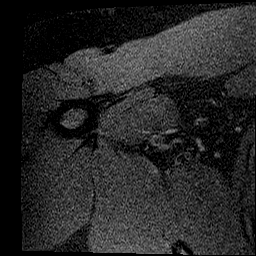
[im 4/28]
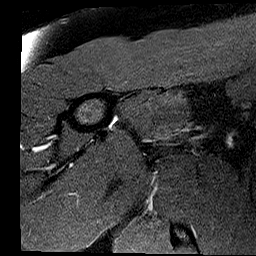
[im 10/28]
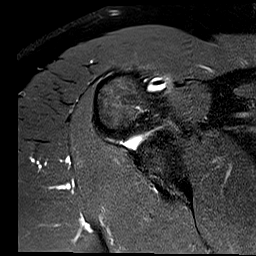
[im 13/28]
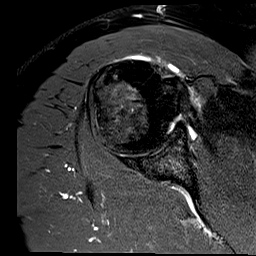
[im 16/28]
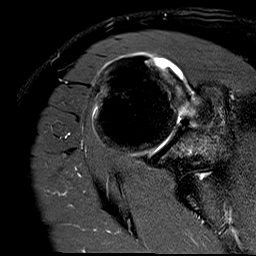
[im 19/28]
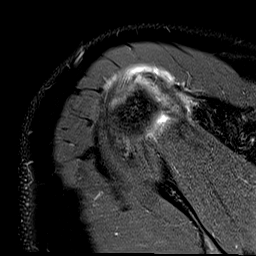
[im 25/28]
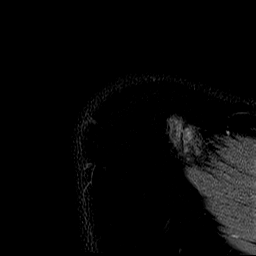
[im 28/28]
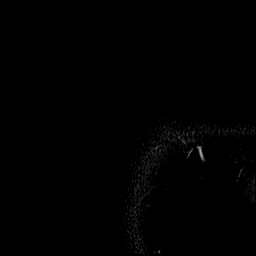

[Series 6: PD fat-sat · oblique · right · 4.0mm · 0.44mm/px · 8 of 26 slices shown (2 of 2)]
[im 1/26]
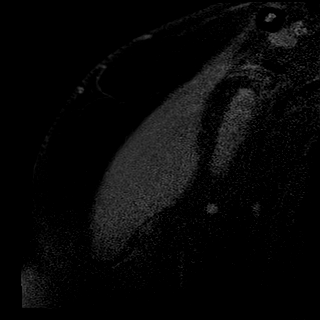
[im 4/26]
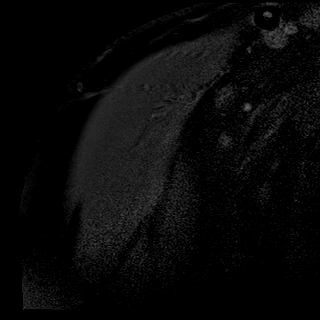
[im 8/26]
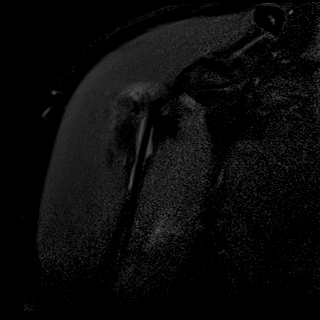
[im 11/26]
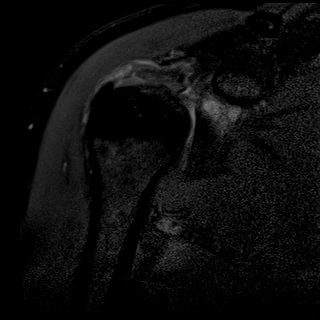
[im 15/26]
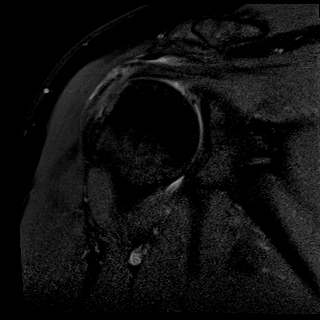
[im 18/26]
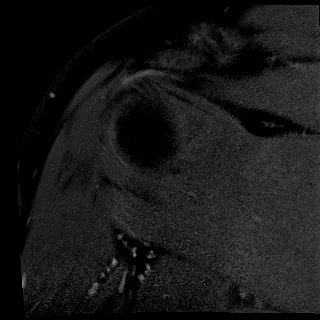
[im 22/26]
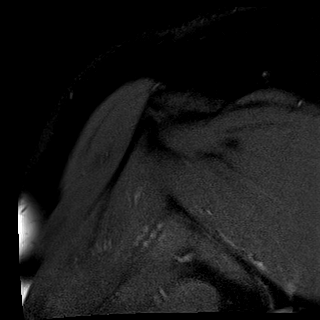
[im 26/26]
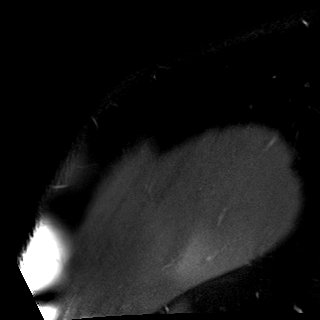

[Series 7: T2 fat-sat · oblique · right · 4.0mm · 0.44mm/px · 8 of 26 slices shown (1 of 2)]
[im 1/26]
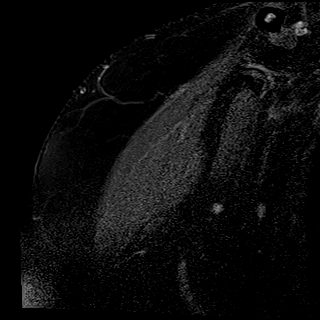
[im 4/26]
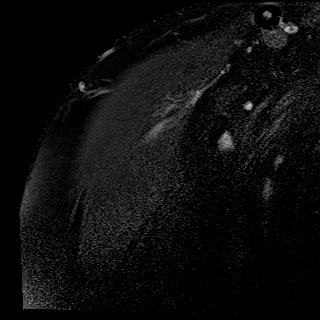
[im 8/26]
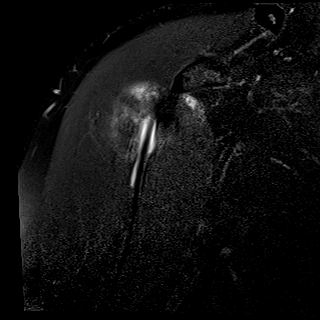
[im 11/26]
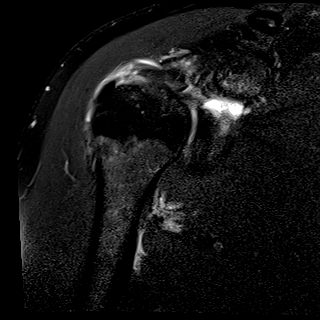
[im 15/26]
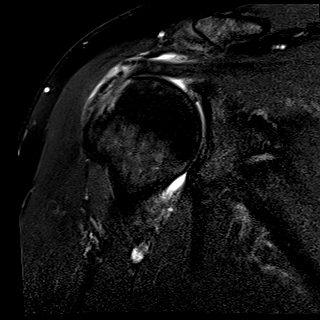
[im 18/26]
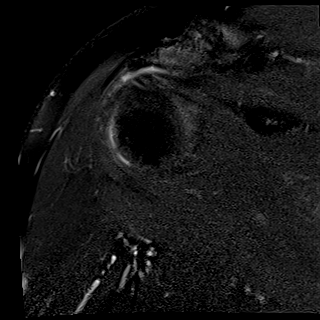
[im 22/26]
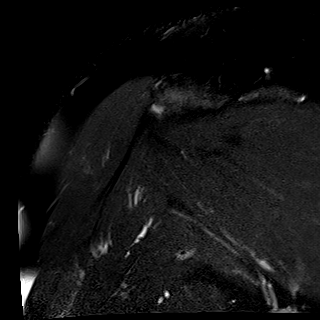
[im 26/26]
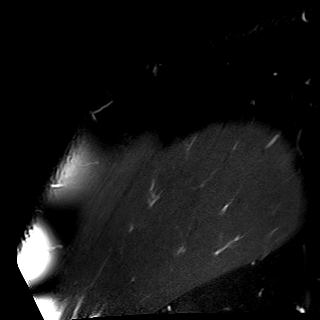

[Series 8: T2 fat-sat · oblique · right · 4.0mm · 0.23mm/px · 7 of 22 slices shown (2 of 2)]
[im 1/22]
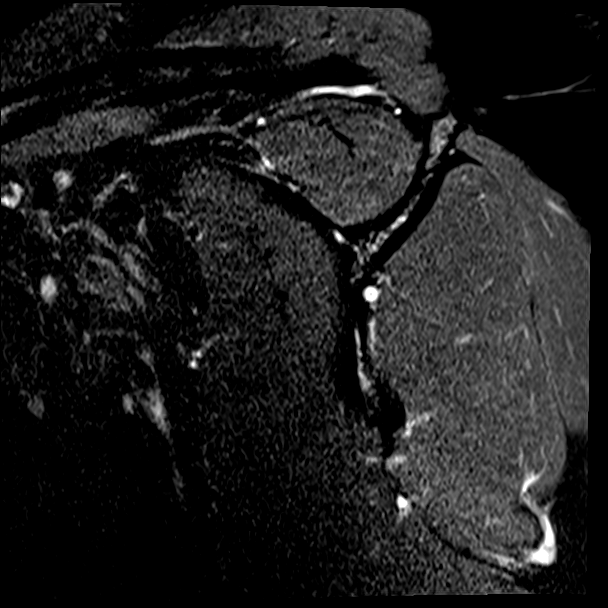
[im 4/22]
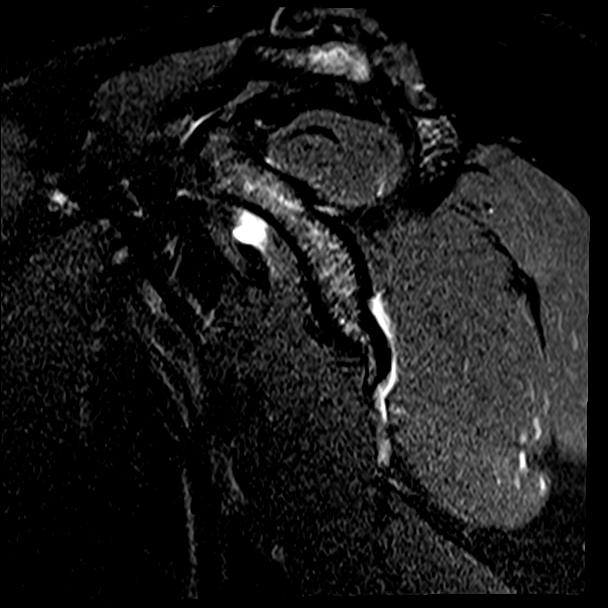
[im 8/22]
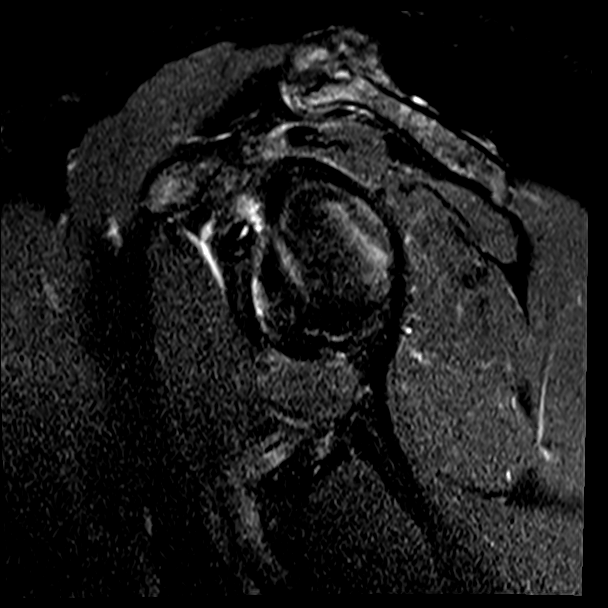
[im 11/22]
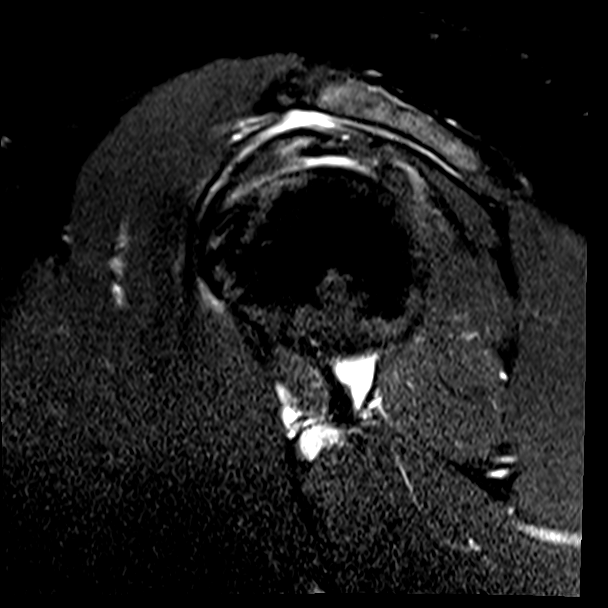
[im 15/22]
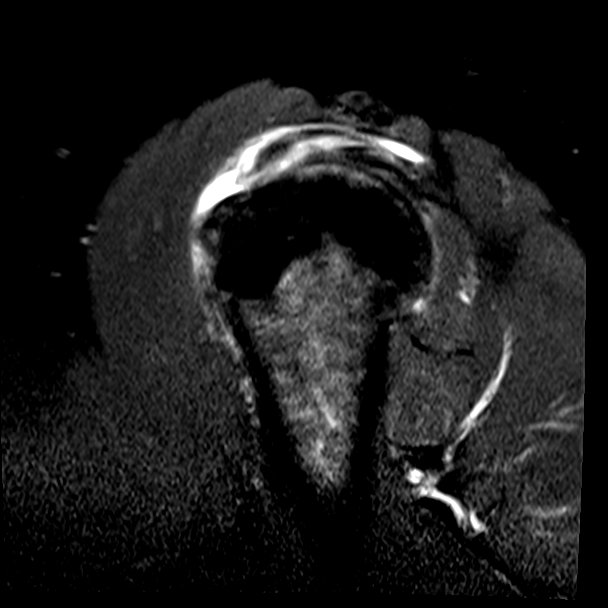
[im 18/22]
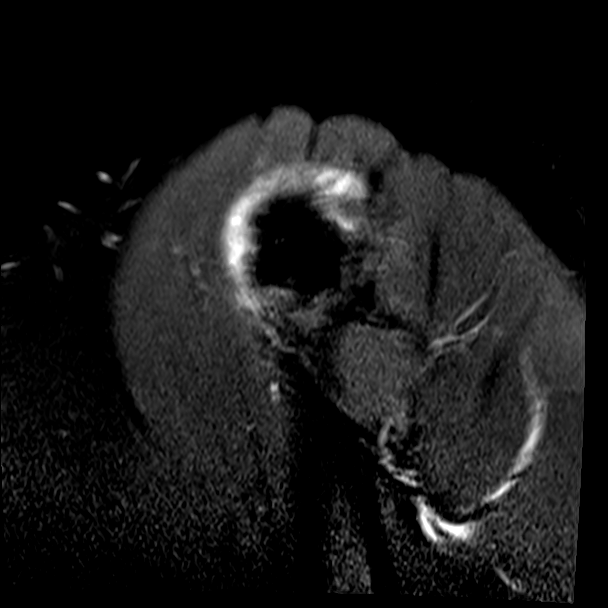
[im 22/22]
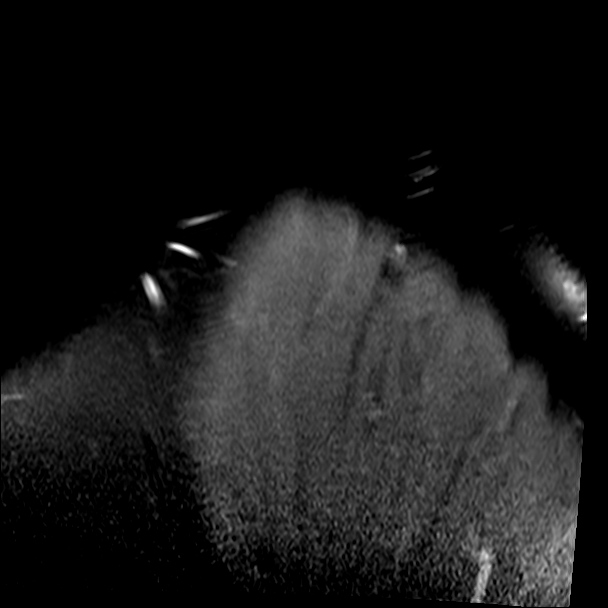

[Series 9: T1 · oblique · right · 4.0mm · 0.36mm/px · 1 of 22 slices shown]
[im 1/22]
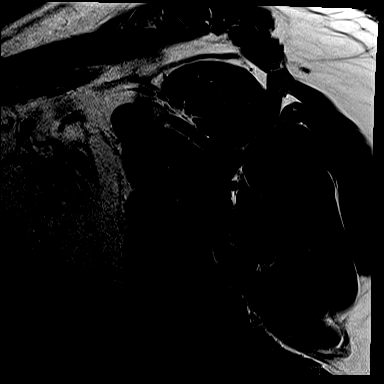

[32 of 40 positions shown; findings below may reference images not displayed]

FINDINGS: Rotator cuff: Severe tendinosis of the supraspinatus tendon with a
10 mm full-thickness tear anteriorly and a partial-thickness bursal
surface tear more posteriorly. Mild tendinosis of the infraspinatus
tendon. Teres minor tendon is intact. Subscapularis tendon is
intact.

Muscles: No atrophy or fatty replacement of nor abnormal signal
within, the muscles of the rotator cuff.

Biceps long head: Moderate tendinosis of the intra-articular portion
of the long head of the biceps tendon.

Acromioclavicular Joint: Moderate arthropathy of the
acromioclavicular joint. Type I acromion. Moderate amount of
subacromial/subdeltoid bursal fluid

Glenohumeral Joint: Small joint effusion.  No chondral defect.

Labrum: Superior labral degeneration. Small tear of the superior
posterior labrum.

Bones:  No acute osseous abnormality.  No aggressive lesion.

Other: No fluid collection or hematoma.
IMPRESSION: 1. Severe tendinosis of the supraspinatus tendon with a 10 mm
full-thickness tear anteriorly and a partial-thickness bursal
surface tear more posteriorly.
2. Mild tendinosis of the infraspinatus tendon.
3. Moderate tendinosis of the intra-articular portion of the long
head of the biceps tendon.
4. Moderate subacromial/subdeltoid bursitis.

## 2021-05-12 ENCOUNTER — Ambulatory Visit
Admission: RE | Admit: 2021-05-12 | Discharge: 2021-05-12 | Disposition: A | Discharge status: Home / Self Care | Payer: Managed Care, Other (non HMO) | Source: Ambulatory Visit

## 2021-05-12 VITALS — BP 135/80 | HR 68 | Temp 98.6°F | Resp 20 | Ht 70.0 in | Wt 305.0 lb

## 2021-05-12 DIAGNOSIS — M25511 Pain in right shoulder: Secondary | ICD-10-CM | POA: Diagnosis not present

## 2021-05-12 MED ORDER — CYCLOBENZAPRINE HCL 10 MG PO TABS: 10.0000 mg | ORAL_TABLET | Freq: Every day | ORAL | 0 refills | Status: DC

## 2021-05-12 MED ORDER — IBUPROFEN 800 MG PO TABS: 800.0000 mg | ORAL_TABLET | Freq: Three times a day (TID) | ORAL | 0 refills | Status: DC

## 2021-05-12 MED ORDER — KETOROLAC TROMETHAMINE 60 MG/2ML IM SOLN
30.0000 mg | Freq: Once | INTRAMUSCULAR | Status: AC
  Administered 2021-05-12: 30 mg via INTRAMUSCULAR

## 2021-05-12 NOTE — ED Provider Notes (Signed)
?[facility] ? ? ? ?CSN: [id number] ?Arrival date & time: 05/12/21  0945 ? ? ?  ? ?History   ?Chief Complaint ?Chief Complaint  ?Patient presents with  ? Generalized Body Aches  ?  Arm/back pain perhaps from pinched nerve - Entered by patient  ? ? ?HPI ?Charles Webb is a 51 y.o. male.  ? ?Patient presents with right shoulder pain beginning at the base of the neck radiating down arm for 1 days. Symptom began abruptly without precipitating event, trauma or injury.  Pain is described as nerve pain but feels like sharp and shooting with movement.  Symptoms are worsening when arm is in a dependent position, no pain is felt with extension.  Has attempted use of Tylenol with no relief.  History of a rotator cuff and bicep injury requiring surgical repair 2 years ago.   ? ?Past Medical History:  ?Diagnosis Date  ? Anxiety   ? Hypertension   ? Sleep apnea   ? ? ?Patient Active Problem List  ? Diagnosis Date Noted  ? Situational anxiety 09/21/2020  ? Essential hypertension 09/21/2020  ? Strain of intrinsic muscle of thumb 08/26/2020  ? Pain of left thumb 08/26/2020  ? Lab test positive for detection of COVID-19 virus 01/03/2019  ? Morbid obesity with BMI of 40.0-44.9, adult ([place]) 05/22/2018  ? Severe obstructive sleep apnea 05/17/2016  ? Overweight 06/20/2011  ? ? ?Past Surgical History:  ?Procedure Laterality Date  ? HERNIA REPAIR    ? hiatal  ? SHOULDER ARTHROSCOPY WITH SUBACROMIAL DECOMPRESSION, ROTATOR CUFF REPAIR AND BICEP TENDON REPAIR Right 07/30/2018  ? Procedure: SHOULDER ARTHROSCOPY WITH ROTATOR CUFF REPAIR, SUBACROMIAL DECOMPRESSION, BICEPS TENODISIS, RIGHT, SLEEP APNEA;  Surgeon: Leim Fabry, MD;  Location: [facility] ORS;  Service: Orthopedics;  Laterality: Right;  ? vascetomy    ? ? ? ? ? ?Home Medications   ? ?Prior to Admission medications   ?Medication Sig Start Date End Date Taking? Authorizing Provider  ?Betahistine HCl POWD 16 mg. Capsule   Yes [provider]   ?losartan-hydrochlorothiazide (HYZAAR) 100-12.5 MG tablet Take 1 tablet by mouth daily. 01/19/20  Yes [provider]  ?predniSONE (DELTASONE) 10 MG tablet 6 tabs PO QAM x 7 days, then 4 tabs PO QAM x 2 days, then 3 tabs PO QAM x 2 days, then 2 tabs PO QAM x 1 day then off 02/06/20  Yes [provider]  ?venlafaxine XR (EFFEXOR-XR) 75 MG 24 hr capsule Take by mouth. 09/21/20 09/21/21 Yes [provider]  ?LORazepam (ATIVAN) 0.5 MG tablet Take 0.5 mg by mouth 2 (two) times daily as needed for anxiety. 07/09/19   [provider]  ?ondansetron (ZOFRAN ODT) 4 MG disintegrating tablet Take 1 tablet (4 mg total) by mouth every 8 (eight) hours as needed for nausea or vomiting. 07/30/18   Leim Fabry, MD  ?traZODone (DESYREL) 50 MG tablet Take 50-100 mg by mouth at bedtime as needed. 12/30/20   [provider]  ? ? ?Family History ?Family History  ?Problem Relation Age of Onset  ? Heart failure Father   ? Healthy Mother   ? ? ?Social History ?Social History  ? ?Tobacco Use  ? Smoking status: Never  ? Smokeless tobacco: Never  ?Vaping Use  ? Vaping Use: Never used  ?Substance Use Topics  ? Alcohol use: Yes  ?  Comment: rarely  ? Drug use: No  ? ? ? ?Allergies   ?Patient has no known allergies. ? ? ?Review of Systems ?  Review of Systems ?Defer to HPI  ? ? ?Physical Exam ?Triage Vital Signs ?ED Triage Vitals  ?Enc Vitals Group  ?   BP 05/12/21 1005 135/80  ?   Pulse Rate 05/12/21 1005 68  ?   Resp 05/12/21 1005 20  ?   Temp 05/12/21 1005 98.6 ?F (37 ?C)  ?   Temp Source 05/12/21 1005 Oral  ?   SpO2 05/12/21 1005 96 %  ?   Weight 05/12/21 1002 (!) 305 lb (138.3 kg)  ?   Height 05/12/21 1002 5\' 10"  (1.778 m)  ?   Head Circumference --   ?   Peak Flow --   ?   Pain Score 05/12/21 1001 7  ?   Pain Loc --   ?   Pain Edu? --   ?   Excl. in [place]? --   ? ?No data found. ? ?Updated Vital Signs ?BP 135/80 (BP Location: Left Arm)   Pulse 68   Temp 98.6 ?F (37 ?C) (Oral)   Resp 20   Ht 5\' 10"   (1.778 m)   Wt (!) 305 lb (138.3 kg)   SpO2 96%   BMI 43.76 kg/m?  ? ?Visual Acuity ?Right Eye Distance:   ?Left Eye Distance:   ?Bilateral Distance:   ? ?Right Eye Near:   ?Left Eye Near:    ?Bilateral Near:    ? ?Physical Exam ?Constitutional:   ?   Appearance: Normal appearance.  ?HENT:  ?   Head: Normocephalic.  ?Eyes:  ?   Extraocular Movements: Extraocular movements intact.  ?Neck:  ?   Comments: No tenderness throughout the midline of the neck or lateral aspects, range of motion intact ?Pulmonary:  ?   Effort: Pulmonary effort is normal.  ?Musculoskeletal:  ?   Comments: Unable to reproduce tenderness to the right shoulder, negative Hawkins sign, range of motion intact, no swelling, deformity or ecchymosis noted  ?Skin: ?   General: Skin is warm and dry.  ?Neurological:  ?   Mental Status: He is alert and oriented to person, place, and time. Mental status is at baseline.  ?Psychiatric:     ?   Mood and Affect: Mood normal.     ?   Behavior: Behavior normal.  ? ? ? ?UC Treatments / Results  ?Labs ?(all labs ordered are listed, but only abnormal results are displayed) ?Labs Reviewed - No data to display ? ?EKG ? ? ?Radiology ?No results found. ? ?Procedures ?Procedures (including critical care time) ? ?Medications Ordered in UC ?Medications - No data to display ? ?Initial Impression / Assessment and Plan / UC Course  ?I have reviewed the triage vital signs and the nursing notes. ? ?Pertinent labs & imaging results that were available during my care of the patient were reviewed by me and considered in my medical decision making (see chart for details). ? ?Acute pain of the right shoulder ? ?Etiology is symptoms are most likely muscular, will defer imaging today due to lack of injury, discussed with patient, Toradol injection given in office for management of pain and ibuprofen answer milligrams 3 times daily for 3 to 5 days recommended for outpatient use, Flexeril prescribed for additional comfort,  recommended RICE, heat, pillows for support, daily stretching and activity as tolerated, given walker referral to orthopedics if symptoms continue to persist for further evaluation and management ?Final Clinical Impressions(s) / UC Diagnoses  ? ?Final diagnoses:  ?None  ? ?Discharge Instructions   ?None ?  ? ?  ED Prescriptions   ?None ?  ? ?PDMP not reviewed this encounter. ?  ?Hans Eden, NP ?05/12/21 1222 ? ?

## 2021-05-12 NOTE — Discharge Instructions (Signed)
Your pain is most likely caused by irritation to the muscles or ligaments.  ? ?You have been given an injection of Toradol in the office today to help to minimize her pain ? ?Begin use of ibuprofen 800 mg 3 times a day for the next 3 to 5 days to further reduce your ?You may use muscle relaxer at bedtime for additional comfort, edema for this medication may make you drowsy ? ?You may use heating pad in 15 minute intervals as needed for additional comfort, within the first 2-3 days you may find comfort in using ice in 10-15 minutes over affected area ? ?Begin stretching affected area daily for 10 minutes as tolerated to further loosen muscles  ? ?When lying down place pillow underneath arm and behind back for support ? ?If pain persist after recommended treatment or reoccurs if may be beneficial to follow up with orthopedic specialist for evaluation, this doctor specializes in the bones and can manage your symptoms long-term with options such as but not limited to imaging, medications or physical therapy  ?  ?

## 2021-05-12 NOTE — ED Triage Notes (Signed)
Patient is here for "pains" that started yesterday "mostly down through right arm, across shoulder, down chest, back sometimes too". No chest pain. No sob. No tingling. No previous injury "a few years ago had a fall and repair in same area". No sob.  ?

## 2022-02-01 ENCOUNTER — Ambulatory Visit
Admission: RE | Admit: 2022-02-01 | Discharge: 2022-02-01 | Disposition: A | Discharge status: Home / Self Care | Payer: Managed Care, Other (non HMO) | Source: Ambulatory Visit | Attending: Nurse Practitioner | Admitting: Nurse Practitioner

## 2022-02-01 VITALS — BP 136/77 | HR 73 | Temp 97.7°F | Resp 18

## 2022-02-01 DIAGNOSIS — R062 Wheezing: Secondary | ICD-10-CM | POA: Diagnosis not present

## 2022-02-01 DIAGNOSIS — J209 Acute bronchitis, unspecified: Secondary | ICD-10-CM

## 2022-02-01 MED ORDER — FLUTICASONE PROPIONATE HFA 110 MCG/ACT IN AERO: 1.0000 | INHALATION_SPRAY | Freq: Two times a day (BID) | RESPIRATORY_TRACT | 0 refills | Status: DC

## 2022-02-01 MED ORDER — ALBUTEROL SULFATE (2.5 MG/3ML) 0.083% IN NEBU
2.5000 mg | INHALATION_SOLUTION | Freq: Once | RESPIRATORY_TRACT | Status: AC
  Administered 2022-02-01: 2.5 mg via RESPIRATORY_TRACT

## 2022-02-01 MED ORDER — IPRATROPIUM-ALBUTEROL 0.5-2.5 (3) MG/3ML IN SOLN: 3.0000 mL | Freq: Once | RESPIRATORY_TRACT | Status: DC

## 2022-02-01 MED ORDER — ALBUTEROL SULFATE HFA 108 (90 BASE) MCG/ACT IN AERS: 1.0000 | INHALATION_SPRAY | Freq: Four times a day (QID) | RESPIRATORY_TRACT | 0 refills | Status: AC | PRN

## 2022-02-01 MED ORDER — BENZONATATE 200 MG PO CAPS: 200.0000 mg | ORAL_CAPSULE | Freq: Three times a day (TID) | ORAL | 0 refills | Status: DC | PRN

## 2022-02-01 MED ORDER — AZITHROMYCIN 250 MG PO TABS: ORAL_TABLET | ORAL | 0 refills | Status: DC

## 2022-02-01 NOTE — ED Provider Notes (Signed)
Charles Webb    CSN: [id number] Arrival date & time: 02/01/22  1523      History   Chief Complaint Chief Complaint  Patient presents with   Cough    Chest congestion / cough for 7 days - some wheezing in lungs. Low energy - Entered by patient   Wheezing    HPI Charles Webb is a 52 y.o. male presents for evaluation of URI symptoms for 8 days. Patient reports associated symptoms of cough, congestion, wheezing.  Reports he had fevers with bodyaches and sore throat at the beginning of illness but those have since resolved.  Denies N/V/D, current fevers, sore throat, body aches, chills, shortness of breath, ear pain. Patient does not have a hx of asthma or smoking. No known sick contacts and no recent travel. Pt is vaccinated for COVID. Pt is not vaccinated for flu this season. Pt has taken cough medicine OTC for symptoms. Pt has no other concerns at this time.    Cough Associated symptoms: wheezing   Wheezing Associated symptoms: cough     Past Medical History:  Diagnosis Date   Anxiety    Hypertension    Sleep apnea     Patient Active Problem List   Diagnosis Date Noted   Situational anxiety 09/21/2020   Essential hypertension 09/21/2020   Strain of intrinsic muscle of thumb 08/26/2020   Pain of left thumb 08/26/2020   Lab test positive for detection of COVID-19 virus 01/03/2019   Morbid obesity with BMI of 40.0-44.9, adult ([facility]) 05/22/2018   Severe obstructive sleep apnea 05/17/2016   Overweight 06/20/2011    Past Surgical History:  Procedure Laterality Date   HERNIA REPAIR     hiatal   SHOULDER ARTHROSCOPY WITH SUBACROMIAL DECOMPRESSION, ROTATOR CUFF REPAIR AND BICEP TENDON REPAIR Right 07/30/2018   Procedure: SHOULDER ARTHROSCOPY WITH ROTATOR CUFF REPAIR, SUBACROMIAL DECOMPRESSION, BICEPS TENODISIS, RIGHT, SLEEP APNEA;  Surgeon: Signa Kell, MD;  Location: [facility] ORS;  Service: Orthopedics;  Laterality: Right;   vascetomy         Home  Medications    Prior to Admission medications   Medication Sig Start Date End Date Taking? Authorizing Provider  albuterol (VENTOLIN HFA) 108 (90 Base) MCG/ACT inhaler Inhale 1-2 puffs into the lungs every 6 (six) hours as needed for wheezing or shortness of breath. 02/01/22  Yes Radford Pax, NP  azithromycin (ZITHROMAX Z-PAK) 250 MG tablet Take 2 tablets by mouth on day 1, and then 1 tablet by mouth daily for 4 days 02/05/22  Yes Radford Pax, NP  benzonatate (TESSALON) 200 MG capsule Take 1 capsule (200 mg total) by mouth 3 (three) times daily as needed for cough. 02/01/22  Yes Radford Pax, NP  fluticasone (FLOVENT HFA) 110 MCG/ACT inhaler Inhale 1 puff into the lungs in the morning and at bedtime for 7 days. 02/01/22 02/08/22 Yes Radford Pax, NP  Betahistine HCl POWD 16 mg. Capsule    [provider]  cyclobenzaprine (FLEXERIL) 10 MG tablet Take 1 tablet (10 mg total) by mouth at bedtime. 05/12/21   White, Elita Boone, NP  ibuprofen (ADVIL) 800 MG tablet Take 1 tablet (800 mg total) by mouth 3 (three) times daily. 05/12/21   White, Elita Boone, NP  LORazepam (ATIVAN) 0.5 MG tablet Take 0.5 mg by mouth 2 (two) times daily as needed for anxiety. 07/09/19   [provider]  losartan-hydrochlorothiazide (HYZAAR) 100-12.5 MG tablet Take 1 tablet by mouth daily. 01/19/20   [provider]  ondansetron (ZOFRAN ODT) 4 MG disintegrating tablet Take 1 tablet (4 mg total) by mouth every 8 (eight) hours as needed for nausea or vomiting. 07/30/18   Leim Fabry, MD  predniSONE (DELTASONE) 10 MG tablet 6 tabs PO QAM x 7 days, then 4 tabs PO QAM x 2 days, then 3 tabs PO QAM x 2 days, then 2 tabs PO QAM x 1 day then off 02/06/20   [provider]  traZODone (DESYREL) 50 MG tablet Take 50-100 mg by mouth at bedtime as needed. 12/30/20   [provider]    Family History Family History  Problem Relation Age of Onset   Heart failure Father    Healthy Mother     Social  History Social History   Tobacco Use   Smoking status: Never   Smokeless tobacco: Never  Vaping Use   Vaping Use: Never used  Substance Use Topics   Alcohol use: Yes    Comment: rarely   Drug use: No     Allergies   Patient has no known allergies.   Review of Systems Review of Systems  HENT:  Positive for congestion.   Respiratory:  Positive for cough and wheezing.      Physical Exam Triage Vital Signs ED Triage Vitals  Enc Vitals Group     BP 02/01/22 1540 136/77     Pulse Rate 02/01/22 1540 73     Resp 02/01/22 1540 18     Temp 02/01/22 1540 97.7 F (36.5 C)     Temp Source 02/01/22 1540 Temporal     SpO2 02/01/22 1540 95 %     Weight --      Height --      Head Circumference --      Peak Flow --      Pain Score 02/01/22 1539 0     Pain Loc --      Pain Edu? --      Excl. in [place]? --    No data found.  Updated Vital Signs BP 136/77 (BP Location: Left Arm)   Pulse 73   Temp 97.7 F (36.5 C) (Temporal)   Resp 18   SpO2 95%   Visual Acuity Right Eye Distance:   Left Eye Distance:   Bilateral Distance:    Right Eye Near:   Left Eye Near:    Bilateral Near:     Physical Exam Vitals and nursing note reviewed.  Constitutional:      General: He is not in acute distress.    Appearance: Normal appearance. He is not ill-appearing or toxic-appearing.  HENT:     Head: Normocephalic and atraumatic.     Right Ear: Tympanic membrane and ear canal normal.     Left Ear: Tympanic membrane and ear canal normal.     Nose: Congestion present.     Mouth/Throat:     Mouth: Mucous membranes are moist.     Pharynx: No oropharyngeal exudate or posterior oropharyngeal erythema.  Eyes:     Pupils: Pupils are equal, round, and reactive to light.  Cardiovascular:     Rate and Rhythm: Normal rate and regular rhythm.     Heart sounds: Normal heart sounds.  Pulmonary:     Effort: Pulmonary effort is normal.     Breath sounds: Wheezing present.     Comments: Mild  expiratory wheezing bilateral bases Musculoskeletal:     Cervical back: Normal range of motion and neck supple.  Lymphadenopathy:  Cervical: No cervical adenopathy.  Skin:    General: Skin is warm and dry.  Neurological:     General: No focal deficit present.     Mental Status: He is alert and oriented to person, place, and time.  Psychiatric:        Mood and Affect: Mood normal.        Behavior: Behavior normal.      UC Treatments / Results  Labs (all labs ordered are listed, but only abnormal results are displayed) Labs Reviewed - No data to display  EKG   Radiology No results found.  Procedures Procedures (including critical care time)  Medications Ordered in UC Medications  albuterol (PROVENTIL) (2.5 MG/3ML) 0.083% nebulizer solution 2.5 mg (2.5 mg Nebulization Given 02/01/22 1552)    Initial Impression / Assessment and Plan / UC Course  I have reviewed the triage vital signs and the nursing notes.  Pertinent labs & imaging results that were available during my care of the patient were reviewed by me and considered in my medical decision making (see chart for details).     Reviewed exam and symptoms with patient. Patient given albuterol nebulizer in clinic (DuoNeb not available) with improvement in wheezing And albuterol as needed Flovent twice daily for 7 days Tessalon as needed Provisional prescription for Zithromax provided.  Patient not to take unless symptoms do not improve or worsen over the next 3 to 4 days and he verbalized understanding Rest and fluids PCP follow-up 2 to 3 days for recheck ER for any worsening symptoms  Final Clinical Impressions(s) / UC Diagnoses   Final diagnoses:  Wheezing  Acute bronchitis, unspecified organism     Discharge Instructions      Albuterol inhaler as needed Flovent twice a day for 7 days Tessalon as needed for cough Provisional prescription for Zithromax provided.  Please do not take that your symptoms  do not improve or worsen over the next 3 to 4 days Rest and fluids Follow-up with your PCP 2 to 3 days for recheck Please go to the emergency room if you have any worsening symptoms     ED Prescriptions     Medication Sig Dispense Auth. Provider   benzonatate (TESSALON) 200 MG capsule Take 1 capsule (200 mg total) by mouth 3 (three) times daily as needed for cough. 20 capsule Melynda Ripple, NP   albuterol (VENTOLIN HFA) 108 (90 Base) MCG/ACT inhaler Inhale 1-2 puffs into the lungs every 6 (six) hours as needed for wheezing or shortness of breath. 1 each Melynda Ripple, NP   fluticasone (FLOVENT HFA) 110 MCG/ACT inhaler Inhale 1 puff into the lungs in the morning and at bedtime for 7 days. 1 each Melynda Ripple, NP   azithromycin (ZITHROMAX Z-PAK) 250 MG tablet Take 2 tablets by mouth on day 1, and then 1 tablet by mouth daily for 4 days 6 each Melynda Ripple, NP      PDMP not reviewed this encounter.   Melynda Ripple, NP 02/01/22 1610

## 2022-02-01 NOTE — Discharge Instructions (Signed)
Albuterol inhaler as needed Flovent twice a day for 7 days Tessalon as needed for cough Provisional prescription for Zithromax provided.  Please do not take that your symptoms do not improve or worsen over the next 3 to 4 days Rest and fluids Follow-up with your PCP 2 to 3 days for recheck Please go to the emergency room if you have any worsening symptoms

## 2022-02-01 NOTE — ED Triage Notes (Signed)
Patient presents to UC for cough and wheezing since 1 week. Low grade fever last weds. Taking OTC cough meds.

## 2022-02-02 ENCOUNTER — Telehealth: Payer: Self-pay

## 2022-02-02 MED ORDER — FLUTICASONE PROPIONATE HFA 110 MCG/ACT IN AERO: 1.0000 | INHALATION_SPRAY | Freq: Two times a day (BID) | RESPIRATORY_TRACT | 12 refills | Status: DC

## 2022-02-02 NOTE — Telephone Encounter (Signed)
Contacted by pharmacy in regards to patient's Flovent prescription. Per pharmacy, brand name medication is out of stock and generic version of medication is not covered by patient's insurance.  Medication discussed with Charles Webb from pharmacy at [facility].  Medication order reviewed with Barkley Boards NP and prescription sent to [facility] in [place]. Patient contacted with updated information.

## 2022-04-21 LAB — COLOGUARD: COLOGUARD: NEGATIVE

## 2023-11-08 ENCOUNTER — Ambulatory Visit
Admission: RE | Admit: 2023-11-08 | Discharge: 2023-11-08 | Disposition: A | Discharge status: Home / Self Care | Payer: Self-pay | Source: Ambulatory Visit

## 2023-11-08 VITALS — BP 153/84 | HR 65 | Temp 98.5°F | Resp 16 | Ht 70.0 in | Wt 310.0 lb

## 2023-11-08 DIAGNOSIS — H6992 Unspecified Eustachian tube disorder, left ear: Secondary | ICD-10-CM

## 2023-11-08 MED ORDER — METHYLPREDNISOLONE 4 MG PO TBPK: ORAL_TABLET | ORAL | 0 refills | Status: DC

## 2023-11-08 NOTE — Discharge Instructions (Addendum)
 As we discussed, I suspect that you have eustachian tube dysfunction on the left-hand side and that is why you are having the ear fullness and intermittent roaring in your ears.  Beginning tomorrow morning start taking the Medrol Dosepak according to the package instructions.  This will decrease inflammation and should help raise turn patency to eustachian tube.  Periodically throughout the day perform the Valsalva maneuver to see if you can clear your left eustachian tube.  If you do not have any improvement of your symptoms with the steroids then you need to see ear nose and throat.

## 2023-11-08 NOTE — ED Provider Notes (Signed)
 [facility]    CSN: [id number] Arrival date & time: 11/08/23  1250      History   Chief Complaint Chief Complaint  Patient presents with   Ear Fullness    HPI Charles Webb is a 53 y.o. male.   HPI  53 year old male with past medical history significant for anxiety, hypertension, and sleep apnea presents for evaluation of left ear fullness.  He reports that approximately 1 month ago he would develop a roaring in his ears that would last about 30 minutes and then resolve.  He denies any dizziness, numbness, tingling, weakness anywhere.  He has on 1 occasion had an brief episode of dizziness that resolved and has not returned.  He has tinnitus at baseline.  Past Medical History:  Diagnosis Date   Anxiety    Hypertension    Sleep apnea     Patient Active Problem List   Diagnosis Date Noted   Situational anxiety 09/21/2020   Essential hypertension 09/21/2020   Strain of intrinsic muscle of thumb 08/26/2020   Pain of left thumb 08/26/2020   Lab test positive for detection of COVID-19 virus 01/03/2019   Morbid obesity with BMI of 40.0-44.9, adult (HCC) 05/22/2018   Severe obstructive sleep apnea 05/17/2016   Overweight 06/20/2011    Past Surgical History:  Procedure Laterality Date   HERNIA REPAIR     hiatal   SHOULDER ARTHROSCOPY WITH SUBACROMIAL DECOMPRESSION, ROTATOR CUFF REPAIR AND BICEP TENDON REPAIR Right 07/30/2018   Procedure: SHOULDER ARTHROSCOPY WITH ROTATOR CUFF REPAIR, SUBACROMIAL DECOMPRESSION, BICEPS TENODISIS, RIGHT, SLEEP APNEA;  Surgeon: Tobie Priest, MD;  Location: [facility];  Service: Orthopedics;  Laterality: Right;   vascetomy         Home Medications    Prior to Admission medications   Medication Sig Start Date End Date Taking? Authorizing Provider  methylPREDNISolone (MEDROL DOSEPAK) 4 MG TBPK tablet Take according to the package insert. 11/08/23  Yes Bernardino Ditch, NP  venlafaxine XR (EFFEXOR-XR) 75 MG 24 hr capsule Take 1  capsule (75 mg total) by mouth once daily for 180 days 10/04/23 04/01/24 Yes [provider]  albuterol  (VENTOLIN  HFA) 108 (90 Base) MCG/ACT inhaler Inhale 1-2 puffs into the lungs every 6 (six) hours as needed for wheezing or shortness of breath. 02/01/22   Mayer, Jodi R, NP  Betahistine HCl POWD 16 mg. Capsule    [provider]  ibuprofen  (ADVIL ) 800 MG tablet Take 1 tablet (800 mg total) by mouth 3 (three) times daily. 05/12/21   White, Shelba SAUNDERS, NP  LORazepam (ATIVAN) 0.5 MG tablet Take 0.5 mg by mouth 2 (two) times daily as needed for anxiety. 07/09/19   [provider]  losartan-hydrochlorothiazide (HYZAAR) 100-12.5 MG tablet Take 1 tablet by mouth daily. 01/19/20   [provider]  ondansetron  (ZOFRAN  ODT) 4 MG disintegrating tablet Take 1 tablet (4 mg total) by mouth every 8 (eight) hours as needed for nausea or vomiting. 07/30/18   Tobie Priest, MD  traZODone (DESYREL) 50 MG tablet Take 50-100 mg by mouth at bedtime as needed. 12/30/20   [provider]    Family History Family History  Problem Relation Age of Onset   Heart failure Father    Healthy Mother     Social History Social History   Tobacco Use   Smoking status: Never   Smokeless tobacco: Never  Vaping Use   Vaping status: Never Used  Substance Use Topics   Alcohol use: Yes  Comment: rarely   Drug use: No     Allergies   Patient has no known allergies.   Review of Systems Review of Systems  HENT:  Positive for ear pain and tinnitus. Negative for ear discharge and hearing loss.   Neurological:  Positive for dizziness and headaches.     Physical Exam Triage Vital Signs ED Triage Vitals  Encounter Vitals Group     BP 11/08/23 1325 (!) 153/84     Girls Systolic BP Percentile --      Girls Diastolic BP Percentile --      Boys Systolic BP Percentile --      Boys Diastolic BP Percentile --      Pulse Rate 11/08/23 1325 65     Resp 11/08/23 1325 16     Temp  11/08/23 1325 98.5 F (36.9 C)     Temp Source 11/08/23 1325 Oral     SpO2 11/08/23 1325 94 %     Weight 11/08/23 1324 (!) 310 lb (140.6 kg)     Height 11/08/23 1324 5' 10 (1.778 m)     Head Circumference --      Peak Flow --      Pain Score 11/08/23 1327 0     Pain Loc --      Pain Education --      Exclude from Growth Chart --    No data found.  Updated Vital Signs BP (!) 153/84 (BP Location: Left Wrist)   Pulse 65   Temp 98.5 F (36.9 C) (Oral)   Resp 16   Ht 5' 10 (1.778 m)   Wt (!) 310 lb (140.6 kg)   SpO2 94%   BMI 44.48 kg/m   Visual Acuity Right Eye Distance:   Left Eye Distance:   Bilateral Distance:    Right Eye Near:   Left Eye Near:    Bilateral Near:     Physical Exam Vitals and nursing note reviewed.  Constitutional:      Appearance: Normal appearance. He is not ill-appearing.  HENT:     Head: Normocephalic and atraumatic.     Right Ear: Ear canal and external ear normal. There is no impacted cerumen.     Left Ear: Ear canal and external ear normal. There is no impacted cerumen.     Ears:     Comments: The superior aspect of both tympanic membranes is erythematous.  There is a serous effusion present in the left middle ear.  Both EACs are clear.  No tenderness with external palpation of bilateral eustachian tubes. Skin:    General: Skin is warm and dry.     Capillary Refill: Capillary refill takes less than 2 seconds.     Findings: No rash.  Neurological:     General: No focal deficit present.     Mental Status: He is alert and oriented to person, place, and time.      UC Treatments / Results  Labs (all labs ordered are listed, but only abnormal results are displayed) Labs Reviewed - No data to display  EKG   Radiology No results found.  Procedures Procedures (including critical care time)  Medications Ordered in UC Medications - No data to display  Initial Impression / Assessment and Plan / UC Course  I have reviewed the  triage vital signs and the nursing notes.  Pertinent labs & imaging results that were available during my care of the patient were reviewed by me and considered in my medical  decision making (see chart for details).   Patient is a pleasant, nontoxic-appearing 53 year old male presenting for evaluation of intermittent roaring in his left ear over the last month.  Today he developed fullness in his ear and reports that when he shakes his head back-and-forth he has a feeling of water being in his ear.  He has had 1 episode of dizziness but not having any dizziness at present.  He has had some slight intermittent headaches but none at present.  No numbness, tingling, or weakness.  He denies changes in vision.  On exam both his tympanic membrane does demonstrate erythema to the superior aspect.  The lower aspect of both pearly gray in appearance.  He does have a serous effusion present in the left middle ear.  He does not have tenderness to external palpation of bilateral eustachian tubes.  I did ask him to perform the Valsalva maneuver and he reports that he is able to clear his right ear but not his left.  He reports that several weeks ago he had a physical and the provider noticed fluid in his ear and recommended that he take antihistamines, which have helped.  Given that he still has a serous effusion and he is unable to clear with the Valsalva maneuver is suggestive of eustachian tube dysfunction.  Given that this been going on for a month I will try a low-dose steroid pack but the patient that he can start tomorrow morning.  I have also encouraged him to continue to attempt to clear his eustachian tube using the Valsalva maneuver.  If his symptoms do not improve he needs to follow-up with ENT for evaluation.   Final Clinical Impressions(s) / UC Diagnoses   Final diagnoses:  Eustachian tube dysfunction, left     Discharge Instructions      As we discussed, I suspect that you have eustachian tube  dysfunction on the left-hand side and that is why you are having the ear fullness and intermittent roaring in your ears.  Beginning tomorrow morning start taking the Medrol Dosepak according to the package instructions.  This will decrease inflammation and should help raise turn patency to eustachian tube.  Periodically throughout the day perform the Valsalva maneuver to see if you can clear your left eustachian tube.  If you do not have any improvement of your symptoms with the steroids then you need to see ear nose and throat.     ED Prescriptions     Medication Sig Dispense Auth. Provider   methylPREDNISolone (MEDROL DOSEPAK) 4 MG TBPK tablet Take according to the package insert. 1 each Bernardino Ditch, NP      PDMP not reviewed this encounter.   Bernardino Ditch, NP 11/08/23 1347

## 2023-11-08 NOTE — ED Triage Notes (Signed)
 Pt states have been experiencing roaring in left ear over last few weeks - comes and goes - sometimes a few times a day - sometimes not at all for a few days. - Entered by patient

## 2024-02-12 ENCOUNTER — Ambulatory Visit
Admission: RE | Admit: 2024-02-12 | Discharge: 2024-02-12 | Disposition: A | Discharge status: Home / Self Care | Payer: Self-pay | Source: Ambulatory Visit | Attending: Physician Assistant | Admitting: Physician Assistant

## 2024-02-12 VITALS — BP 142/78 | HR 65 | Temp 98.3°F | Resp 18 | Wt 332.5 lb

## 2024-02-12 DIAGNOSIS — R519 Headache, unspecified: Secondary | ICD-10-CM | POA: Diagnosis not present

## 2024-02-12 DIAGNOSIS — G4733 Obstructive sleep apnea (adult) (pediatric): Secondary | ICD-10-CM | POA: Diagnosis not present

## 2024-02-12 DIAGNOSIS — I10 Essential (primary) hypertension: Secondary | ICD-10-CM | POA: Diagnosis not present

## 2024-02-12 MED ORDER — KETOROLAC TROMETHAMINE 60 MG/2ML IM SOLN
60.0000 mg | Freq: Once | INTRAMUSCULAR | Status: AC
  Administered 2024-02-12: 60 mg via INTRAMUSCULAR

## 2024-02-12 MED ORDER — NAPROXEN 500 MG PO TABS: 500.0000 mg | ORAL_TABLET | Freq: Two times a day (BID) | ORAL | 0 refills | Status: AC | PRN

## 2024-02-12 NOTE — Discharge Instructions (Addendum)
-   We gave you an injection of Toradol  in the clinic.  This is an anti-inflammatory medication.  It should help with your headache.  I also sent generic Aleve  called naproxen  to pharmacy.  May continue Tylenol  and Allegra if desired. - Continue blood pressure medication and monitoring symptoms. - If you have facial drooping, slurred speech, numbness/tingling, weakness to the extremities, chest pain, palpitations, shortness of breath, severe neck pain, acute worsening of headache please call 911 or have someone go to the ER. - If you are trying the NSAIDs over the next few days and symptoms are not improving make a PCP follow-up appointment for further evaluation and considering of imaging if needed.

## 2024-02-12 NOTE — ED Triage Notes (Signed)
 Pt present headache all over his head except for frontal part of head. Symptoms started over a week ago. Pt tried OTC medication with no relief.

## 2024-02-12 NOTE — ED Provider Notes (Signed)
 " [facility]    CSN: [id number] Arrival date & time: 02/12/24  0931      History   Chief Complaint Chief Complaint  Patient presents with   Headache    Headaches mainly in back/side of head for the last week. - Entered by patient    HPI Charles Webb is a 54 y.o. male with history of anxiety, hypertension, severe obstructive sleep apnea, and morbid obesity.  Today, patient presents for occipital headaches for 7 to 9 days.  He says pain is generally mild and rates it at about a 3 out of 10.  Sometimes pain is sharp and throbbing.  At the worst the pain is about a 5-6 out of 10.  He has a little bit of pain in his neck but denies neck stiffness.  Slight nasal congestion but no sinus pain, discolored nasal drainage, cough, frontal headaches.  Denies fever, body aches, chest pain, shortness of breath.  Has not had any facial drooping, slurred speech, difficulty with speech or balance, numbness/tingling or weakness of extremities.  Has been taking Tylenol  and Allegra which improved symptoms mildly.  Has not tried any NSAIDs.  No history of headaches or migraines.  Patient says his sleep apnea is well-controlled and he uses his machine as instructed.  Has also been consistently taking Hyzaar for hypertension.  HPI  Past Medical History:  Diagnosis Date   Anxiety    Hypertension    Sleep apnea     Patient Active Problem List   Diagnosis Date Noted   Situational anxiety 09/21/2020   Essential hypertension 09/21/2020   Strain of intrinsic muscle of thumb 08/26/2020   Pain of left thumb 08/26/2020   Lab test positive for detection of COVID-19 virus 01/03/2019   Morbid obesity with BMI of 40.0-44.9, adult (HCC) 05/22/2018   Severe obstructive sleep apnea 05/17/2016   Overweight 06/20/2011    Past Surgical History:  Procedure Laterality Date   HERNIA REPAIR     hiatal   SHOULDER ARTHROSCOPY WITH SUBACROMIAL DECOMPRESSION, ROTATOR CUFF REPAIR AND BICEP TENDON REPAIR  Right 07/30/2018   Procedure: SHOULDER ARTHROSCOPY WITH ROTATOR CUFF REPAIR, SUBACROMIAL DECOMPRESSION, BICEPS TENODISIS, RIGHT, SLEEP APNEA;  Surgeon: Tobie Priest, MD;  Location: [facility];  Service: Orthopedics;  Laterality: Right;   vascetomy         Home Medications    Prior to Admission medications  Medication Sig Start Date End Date Taking? Authorizing Provider  naproxen  (NAPROSYN ) 500 MG tablet Take 1 tablet (500 mg total) by mouth 2 (two) times daily as needed for headache. 02/12/24  Yes Arvis Jolan NOVAK, PA-C  albuterol  (VENTOLIN  HFA) 108 (90 Base) MCG/ACT inhaler Inhale 1-2 puffs into the lungs every 6 (six) hours as needed for wheezing or shortness of breath. 02/01/22   Mayer, Jodi R, NP  Betahistine HCl POWD 16 mg. Capsule    [provider]  LORazepam (ATIVAN) 0.5 MG tablet Take 0.5 mg by mouth 2 (two) times daily as needed for anxiety. 07/09/19   [provider]  losartan-hydrochlorothiazide (HYZAAR) 100-12.5 MG tablet Take 1 tablet by mouth daily. 01/19/20   [provider]  traZODone (DESYREL) 50 MG tablet Take 50-100 mg by mouth at bedtime as needed. 12/30/20   [provider]  venlafaxine XR (EFFEXOR-XR) 75 MG 24 hr capsule Take 1 capsule (75 mg total) by mouth once daily for 180 days 10/04/23 04/01/24  [provider]    Family History Family History  Problem Relation Age  of Onset   Heart failure Father    Healthy Mother     Social History Social History[1]   Allergies   Patient has no known allergies.   Review of Systems Review of Systems  Constitutional:  Negative for fatigue and fever.  HENT:  Negative for congestion, ear pain, facial swelling, rhinorrhea and sore throat.   Eyes:  Negative for photophobia and visual disturbance.  Respiratory:  Negative for shortness of breath.   Cardiovascular:  Negative for chest pain and palpitations.  Gastrointestinal:  Negative for nausea and vomiting.  Musculoskeletal:  Positive  for neck pain. Negative for arthralgias, myalgias and neck stiffness.  Neurological:  Positive for headaches. Negative for dizziness, tremors, seizures, syncope, facial asymmetry, speech difficulty, weakness, light-headedness and numbness.     Physical Exam Triage Vital Signs ED Triage Vitals  Encounter Vitals Group     BP 02/12/24 0943 (!) 142/78     Girls Systolic BP Percentile --      Girls Diastolic BP Percentile --      Boys Systolic BP Percentile --      Boys Diastolic BP Percentile --      Pulse Rate 02/12/24 0943 65     Resp 02/12/24 0943 18     Temp 02/12/24 0943 98.3 F (36.8 C)     Temp Source 02/12/24 0943 Oral     SpO2 02/12/24 0943 94 %     Weight 02/12/24 0942 (!) 332 lb 8 oz (150.8 kg)     Height --      Head Circumference --      Peak Flow --      Pain Score 02/12/24 0942 6     Pain Loc --      Pain Education --      Exclude from Growth Chart --    No data found.  Updated Vital Signs BP (!) 142/78 (BP Location: Right Arm)   Pulse 65   Temp 98.3 F (36.8 C) (Oral)   Resp 18   Wt (!) 332 lb 8 oz (150.8 kg)   SpO2 94%   BMI 47.71 kg/m    Physical Exam Vitals and nursing note reviewed.  Constitutional:      General: He is not in acute distress.    Appearance: Normal appearance. He is well-developed. He is obese. He is not ill-appearing.  HENT:     Head: Normocephalic and atraumatic.     Right Ear: Ear canal and external ear normal. A middle ear effusion is present.     Left Ear: Ear canal and external ear normal. A middle ear effusion is present.     Nose: Nose normal.     Mouth/Throat:     Mouth: Mucous membranes are moist.     Pharynx: Oropharynx is clear.  Eyes:     General: No scleral icterus.    Extraocular Movements: Extraocular movements intact.     Conjunctiva/sclera: Conjunctivae normal.     Pupils: Pupils are equal, round, and reactive to light.  Cardiovascular:     Rate and Rhythm: Normal rate and regular rhythm.  Pulmonary:      Effort: Pulmonary effort is normal. No respiratory distress.     Breath sounds: Normal breath sounds.  Musculoskeletal:     Cervical back: Normal range of motion and neck supple. No rigidity or tenderness.  Skin:    General: Skin is warm and dry.     Capillary Refill: Capillary refill takes less than 2 seconds.  Neurological:     General: No focal deficit present.     Mental Status: He is alert and oriented to person, place, and time. Mental status is at baseline.     Cranial Nerves: No cranial nerve deficit.     Motor: No weakness.     Coordination: Coordination normal.     Gait: Gait normal.     Comments: Full strength of upper and lower extremities bilaterally.  Psychiatric:        Mood and Affect: Mood normal.        Behavior: Behavior normal.      UC Treatments / Results  Labs (all labs ordered are listed, but only abnormal results are displayed) Labs Reviewed - No data to display Comprehensive Metabolic Panel (CMP) Order: [id number] Component Ref Range & Units 4 mo ago  Sodium 135 - 145 mmol/L 138  Potassium 3.5 - 5.0 mmol/L 3.7  Chloride 98 - 108 mmol/L 103  Carbon Dioxide (CO2) 21 - 30 mmol/L 24  Urea Nitrogen (BUN) 7 - 20 mg/dL 10  Creatinine 0.6 - 1.3 mg/dL 1.1  Glucose 70 - 859 mg/dL 99  Comment: Interpretive Data: Above is the NONFASTING reference range.  Below are the FASTING reference ranges: NORMAL:      70-99 mg/dL PREDIABETES: 899-874 mg/dL DIABETES:    > 874 mg/dL  Calcium 8.7 - 89.7 mg/dL 8.8  AST (Aspartate Aminotransferase) 15 - 41 U/L 24  ALT (Alanine Aminotransferase) 15 - 50 U/L 24  Bilirubin, Total 0.4 - 1.5 mg/dL 0.5  Alk Phos (Alkaline Phosphatase) 24 - 110 U/L 81  Albumin 3.5 - 4.8 g/dL 3.6  Protein, Total 6.2 - 8.1 g/dL 6.8  Anion Gap 3 - 12 mmol/L 11  BUN/CREA Ratio 6 - 27 9  Glomerular Filtration Rate (eGFR) mL/min/1.73sq m 80  Comment: CKD-EPI (2021) does not include patient's race in the calculation of eGFR.  Monitoring changes of plasma creatinine and eGFR over time is useful for monitoring kidney function.  This change was made on 03/31/2020.  Interpretive Ranges for eGFR(CKD-EPI 2021):  eGFR:              > 60 mL/min/1.73 sq m - Normal eGFR:              30 - 59 mL/min/1.73 sq m - Moderately Decreased eGFR:              15 - 29 mL/min/1.73 sq m - Severely Decreased eGFR:              < 15 mL/min/1.73 sq m -  Kidney Failure   Note: These eGFR calculations do not apply in acute situations when eGFR is changing rapidly or in patients on dialysis.  Resulting Agency [facility]   Specimen Collected: 10/04/23 15:34   Performed by: [facility] Last Resulted: 10/04/23 21:44  Received From: [facility]  Result Received: 11/08/23 12:50   View Encounter     EKG   Radiology No results found.  Procedures Procedures (including critical care time)  Medications Ordered in UC Medications  ketorolac  (TORADOL ) injection 60 mg (60 mg Intramuscular Given 02/12/24 1012)    Initial Impression / Assessment and Plan / UC Course  I have reviewed the triage vital signs and the nursing notes.  Pertinent labs & imaging results that were available during my care of the patient were reviewed by me and considered in my medical decision making (see chart for details).  29-year-old male with history of anxiety, hypertension, obesity and severe OSA presents for greater than 1 week history of occipital headaches.  Denies any red flag signs or symptoms.  No history of headaches or migraines.  Takes Tylenol  and Allegra which helps somewhat.  BP elevated 142/78.  Advised to continue Hyzaar.  If consistently greater than 140/90 he should see PCP.  Normal neuroexam.  5 out of 5 strength bilateral upper and lower extremities.  Tiny effusion of bilateral TMs without erythema or bulging and no significant nasal congestion.  Chest clear.  Heart regular rate and  rhythm.  Suspect sinus type headache or tension type headache.  Supportive care encouraged.  Advised starting NSAIDs.  Checked renal function from a few months ago and it was normal.  Patient given 60 mg IM ketorolac  in clinic for acute headache relief.  Prescribed naproxen .  May continue Tylenol  and Allegra.  Advised making PCP appointment in a few days if symptoms or not improving.  May consider CT imaging since he has no history of headaches.  Explained headaches could be related to sleep apnea and to make sure he is using his machine as instructed.  We also reviewed ED red flag signs and symptoms.  This was outlined in his handout.   Final Clinical Impressions(s) / UC Diagnoses   Final diagnoses:  Acute nonintractable headache, unspecified headache type  Essential hypertension  Severe obstructive sleep apnea     Discharge Instructions      - We gave you an injection of Toradol  in the clinic.  This is an anti-inflammatory medication.  It should help with your headache.  I also sent generic Aleve  called naproxen  to pharmacy.  May continue Tylenol  and Allegra if desired. - Continue blood pressure medication and monitoring symptoms. - If you have facial drooping, slurred speech, numbness/tingling, weakness to the extremities, chest pain, palpitations, shortness of breath, severe neck pain, acute worsening of headache please call 911 or have someone go to the ER. - If you are trying the NSAIDs over the next few days and symptoms are not improving make a PCP follow-up appointment for further evaluation and considering of imaging if needed.     ED Prescriptions     Medication Sig Dispense Auth. Provider   naproxen  (NAPROSYN ) 500 MG tablet Take 1 tablet (500 mg total) by mouth 2 (two) times daily as needed for headache. 30 tablet Emerald Shor B, PA-C      PDMP not reviewed this encounter.     [1]  Social History Tobacco Use   Smoking status: Never   Smokeless tobacco: Never   Vaping Use   Vaping status: Never Used  Substance Use Topics   Alcohol use: Yes    Comment: rarely   Drug use: No     Arvis Jolan NOVAK, PA-C 02/12/24 1032  "
